# Patient Record
Sex: Male | Born: 1960 | Race: White | Hispanic: No | Marital: Married | Smoking: Never smoker
Health system: Southern US, Community
[De-identification: ages and names within clinical notes are randomized; demographics above are authoritative.]

## PROBLEM LIST (undated history)

## (undated) DIAGNOSIS — F32A Depression, unspecified: Secondary | ICD-10-CM

## (undated) DIAGNOSIS — K601 Chronic anal fissure: Secondary | ICD-10-CM

## (undated) DIAGNOSIS — N400 Enlarged prostate without lower urinary tract symptoms: Secondary | ICD-10-CM

## (undated) DIAGNOSIS — F329 Major depressive disorder, single episode, unspecified: Secondary | ICD-10-CM

## (undated) DIAGNOSIS — I10 Essential (primary) hypertension: Secondary | ICD-10-CM

## (undated) DIAGNOSIS — E8881 Metabolic syndrome: Secondary | ICD-10-CM

## (undated) DIAGNOSIS — R0602 Shortness of breath: Secondary | ICD-10-CM

## (undated) DIAGNOSIS — E785 Hyperlipidemia, unspecified: Secondary | ICD-10-CM

## (undated) DIAGNOSIS — E119 Type 2 diabetes mellitus without complications: Secondary | ICD-10-CM

## (undated) DIAGNOSIS — G4730 Sleep apnea, unspecified: Secondary | ICD-10-CM

## (undated) HISTORY — DX: Hyperlipidemia, unspecified: E78.5

## (undated) HISTORY — DX: Essential (primary) hypertension: I10

## (undated) HISTORY — DX: Sleep apnea, unspecified: G47.30

## (undated) HISTORY — PX: CHOLECYSTECTOMY: SHX55

## (undated) HISTORY — DX: Type 2 diabetes mellitus without complications: E11.9

## (undated) HISTORY — DX: Metabolic syndrome: E88.81

## (undated) HISTORY — DX: Benign prostatic hyperplasia without lower urinary tract symptoms: N40.0

## (undated) HISTORY — DX: Metabolic syndrome: E88.810

---

## 1998-07-30 ENCOUNTER — Ambulatory Visit (HOSPITAL_BASED_OUTPATIENT_CLINIC_OR_DEPARTMENT_OTHER): Admission: RE | Admit: 1998-07-30 | Discharge: 1998-07-30 | Payer: Self-pay | Admitting: Ophthalmology

## 2001-01-24 ENCOUNTER — Emergency Department (HOSPITAL_COMMUNITY): Admission: EM | Admit: 2001-01-24 | Discharge: 2001-01-24 | Payer: Self-pay | Admitting: Emergency Medicine

## 2001-01-24 ENCOUNTER — Encounter: Payer: Self-pay | Admitting: Emergency Medicine

## 2001-02-02 ENCOUNTER — Encounter: Payer: Self-pay | Admitting: Orthopedic Surgery

## 2001-02-02 ENCOUNTER — Ambulatory Visit (HOSPITAL_COMMUNITY): Admission: RE | Admit: 2001-02-02 | Discharge: 2001-02-02 | Payer: Self-pay | Admitting: Orthopedic Surgery

## 2001-06-04 ENCOUNTER — Encounter: Payer: Self-pay | Admitting: Family Medicine

## 2001-06-04 ENCOUNTER — Ambulatory Visit (HOSPITAL_COMMUNITY): Admission: RE | Admit: 2001-06-04 | Discharge: 2001-06-04 | Payer: Self-pay | Admitting: Family Medicine

## 2002-04-27 ENCOUNTER — Ambulatory Visit (HOSPITAL_BASED_OUTPATIENT_CLINIC_OR_DEPARTMENT_OTHER): Admission: RE | Admit: 2002-04-27 | Discharge: 2002-04-27 | Payer: Self-pay | Admitting: Family Medicine

## 2003-10-04 ENCOUNTER — Emergency Department (HOSPITAL_COMMUNITY): Admission: AD | Admit: 2003-10-04 | Discharge: 2003-10-04 | Payer: Self-pay | Admitting: Family Medicine

## 2004-05-01 ENCOUNTER — Emergency Department (HOSPITAL_COMMUNITY): Admission: EM | Admit: 2004-05-01 | Discharge: 2004-05-01 | Payer: Self-pay | Admitting: Family Medicine

## 2004-05-03 ENCOUNTER — Ambulatory Visit (HOSPITAL_COMMUNITY): Admission: RE | Admit: 2004-05-03 | Discharge: 2004-05-03 | Payer: Self-pay | Admitting: Orthopedic Surgery

## 2004-05-03 ENCOUNTER — Encounter (INDEPENDENT_AMBULATORY_CARE_PROVIDER_SITE_OTHER): Payer: Self-pay | Admitting: *Deleted

## 2004-05-03 ENCOUNTER — Ambulatory Visit (HOSPITAL_BASED_OUTPATIENT_CLINIC_OR_DEPARTMENT_OTHER): Admission: RE | Admit: 2004-05-03 | Discharge: 2004-05-03 | Payer: Self-pay | Admitting: Orthopedic Surgery

## 2010-06-19 ENCOUNTER — Ambulatory Visit (HOSPITAL_COMMUNITY): Admission: RE | Admit: 2010-06-19 | Discharge: 2010-06-19 | Payer: Self-pay | Admitting: General Surgery

## 2010-09-29 HISTORY — PX: APPENDECTOMY: SHX54

## 2010-11-27 IMAGING — CR DG CHEST 2V
2 series · 2 of 2 positions shown · non-contrast
Comparison: None.

CLINICAL DATA: Preop

CHEST - 2 VIEW

[w chest pa *]
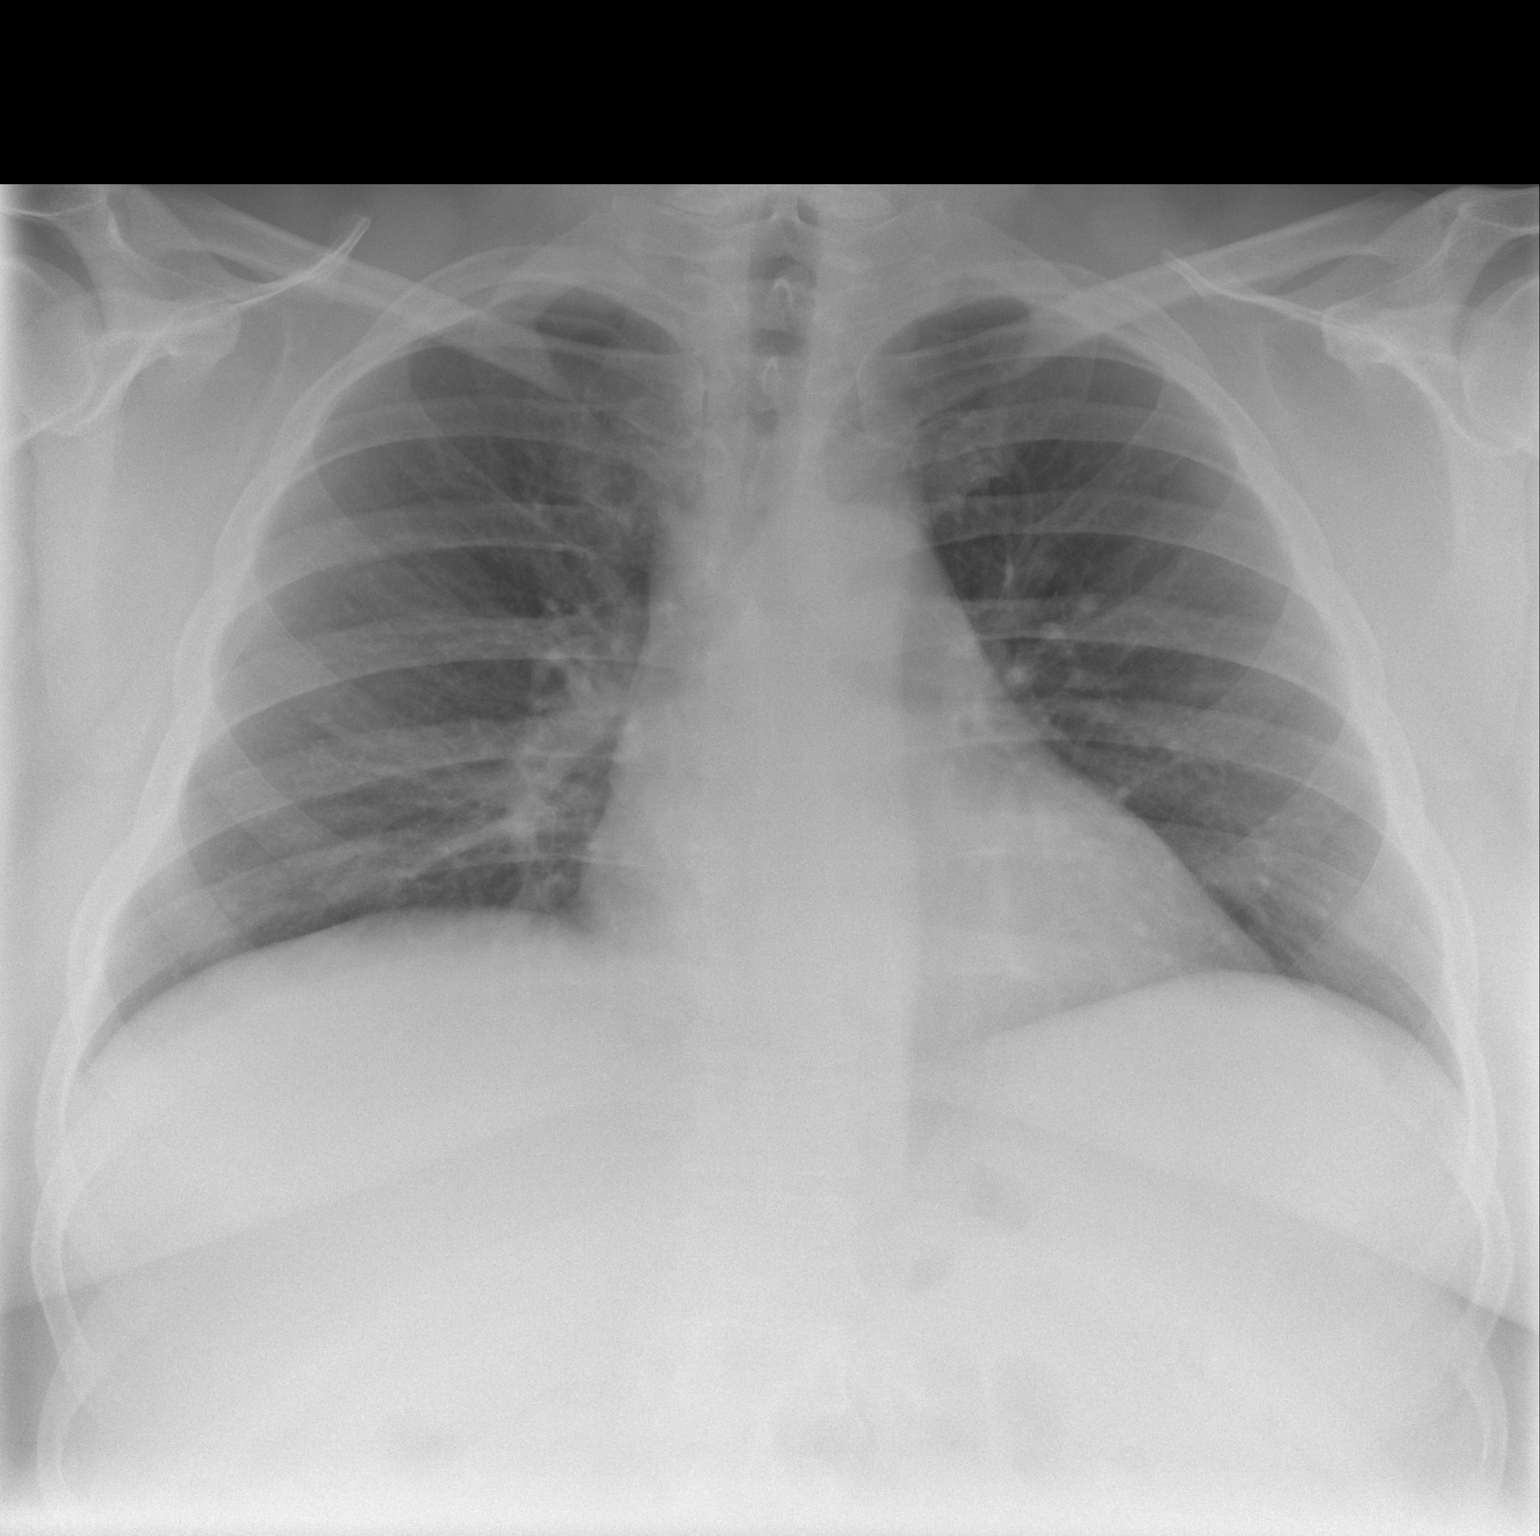

[w chest lat *]
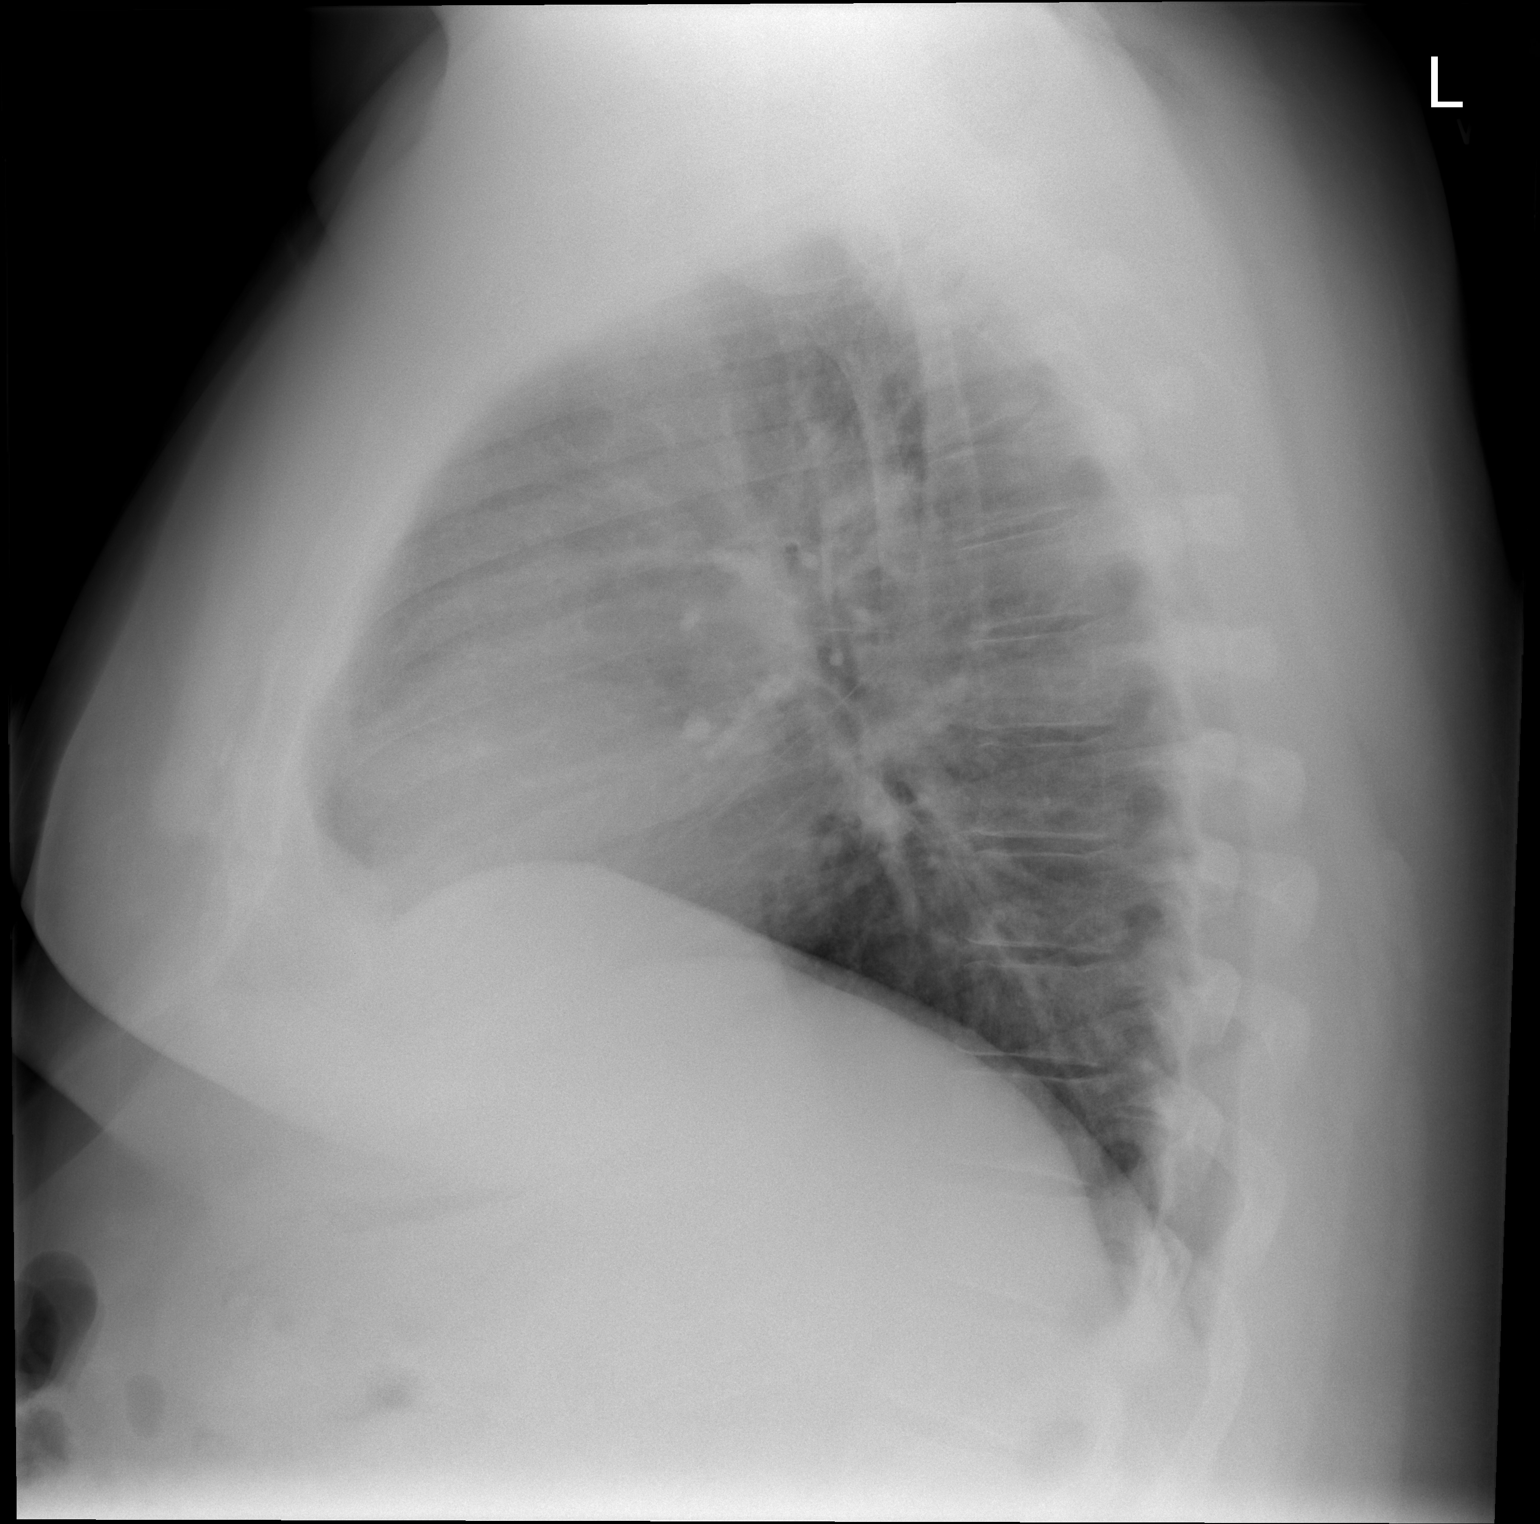

[2 of 2 positions shown; findings below may reference images not displayed]

FINDINGS: Normal mediastinum and heart silhouette.  Costophrenic
angles are clear.  No evidence of effusion, and trace, or
pneumothorax.
IMPRESSION: Normal chest radiograph.

## 2010-12-12 LAB — DIFFERENTIAL
Basophils Absolute: 0 10*3/uL (ref 0.0–0.1)
Basophils Relative: 0 % (ref 0–1)
Eosinophils Relative: 2 % (ref 0–5)
Lymphocytes Relative: 21 % (ref 12–46)
Monocytes Absolute: 0.7 10*3/uL (ref 0.1–1.0)

## 2010-12-12 LAB — CBC
HCT: 43.5 % (ref 39.0–52.0)
MCHC: 34.1 g/dL (ref 30.0–36.0)
MCV: 86.6 fL (ref 78.0–100.0)
RDW: 15.4 % (ref 11.5–15.5)

## 2010-12-12 LAB — BASIC METABOLIC PANEL
BUN: 15 mg/dL (ref 6–23)
CO2: 30 mEq/L (ref 19–32)
Calcium: 9.2 mg/dL (ref 8.4–10.5)
Chloride: 103 mEq/L (ref 96–112)
Creatinine, Ser: 1.11 mg/dL (ref 0.4–1.5)
GFR calc Af Amer: >60 mL/min (ref >60)
GFR calc non Af Amer: >60 mL/min (ref >60)
Glucose, Bld: 86 mg/dL (ref 70–99)
Potassium: 4 mEq/L (ref 3.5–5.1)
Sodium: 139 mEq/L (ref 135–145)

## 2010-12-12 LAB — SURGICAL PCR SCREEN
MRSA, PCR: NEGATIVE
Staphylococcus aureus: NEGATIVE

## 2011-02-14 NOTE — Op Note (Signed)
NAME:  Richard Serrano, Richard Serrano                        ACCOUNT NO.:  [id number]   MEDICAL RECORD NO.:  [id number]                   PATIENT TYPE:  AMB   LOCATION:  DSC                                  FACILITY:  [facility]   PHYSICIAN:  Cindee Salt, M.D.                    DATE OF BIRTH:  [birth year]   DATE OF PROCEDURE:  05/04/2004  DATE OF DISCHARGE:  05/03/2004                                 OPERATIVE REPORT   PREOPERATIVE DIAGNOSIS:  Mass, right ring finger.   POSTOPERATIVE DIAGNOSIS:  Mass, right ring finger.   OPERATION:  Excision of mass, right ring finger.   SURGEON:  Cindee Salt, M.D.   ASSISTANTCarolyne Fiscal.   ANESTHESIA:  Forearm-based IV regional anesthetic.   INDICATIONS FOR PROCEDURE:  The patient is a 50 year old male who suffered  an injury to his right ring finger with a piece of wood from a palate.  This  occurred approximately five months ago.  He has had progressive pain and  swelling, and when attempted removal was unsuccessful.  He is admitted now  for an excision of a fungating-type lesion from his right ring finger.   DESCRIPTION OF PROCEDURE:  The patient is brought to the operating room  where a forearm-based IV regional anesthetic was carried out without  difficulty.  He was prepped using DuraPrep, in the supine position, with his  right arm free.  After adequate anesthesia was given to the patient, an  oblique incision was made with an excision of the entire mass.  With blunt  and sharp dissection this was taken down through the subcutaneous tissue.  The entire mass was excised and sent to pathology.  The wound was irrigated.  The exploration revealed no further foreign material.  Bleeders were  electrocauterized.  The wound irrigated, and the skin closed with figure-of-  eight of horizontal mattress #5-0 nylon sutures.  A sterile compressive  dressing and splint to the finger were applied.  The patient tolerated the procedure well and was taken to the recovery room  for observation, in satisfactory condition.   DISPOSITION:  He is discharged home, to return to the [facility]  [place] in one week, on Vicodin.  He is presently taking Augmentin.                                               Cindee Salt, M.D.    Angelique Blonder  D:  05/03/2004  T:  05/05/2004  Job:  [id number]

## 2011-11-11 ENCOUNTER — Encounter: Payer: Self-pay | Admitting: Internal Medicine

## 2011-12-11 ENCOUNTER — Encounter: Payer: Self-pay | Admitting: Internal Medicine

## 2012-10-20 ENCOUNTER — Encounter: Payer: Self-pay | Admitting: Internal Medicine

## 2012-12-06 ENCOUNTER — Ambulatory Visit (AMBULATORY_SURGERY_CENTER): Payer: BC Managed Care – PPO | Admitting: *Deleted

## 2012-12-06 ENCOUNTER — Telehealth: Payer: Self-pay | Admitting: *Deleted

## 2012-12-06 VITALS — Ht 70.0 in | Wt 370.0 lb

## 2012-12-06 DIAGNOSIS — Z1211 Encounter for screening for malignant neoplasm of colon: Secondary | ICD-10-CM

## 2012-12-06 MED ORDER — NA SULFATE-K SULFATE-MG SULF 17.5-3.13-1.6 GM/177ML PO SOLN: ORAL | Status: DC

## 2012-12-06 NOTE — Telephone Encounter (Signed)
Patient is directed for screening colonoscopy, he did see Dr.Shaw for rectal pain and was given medications to try. He states he is no longer having any rectal pain, but needs routine colonoscopy. He has never had this. Patient weight is 370 lbs. He does have sleep apnea and uses a CPAP. Explained to patient that he needs hospital colonoscopy and that we would call him with this appointment. He understands. Thanks, [employer]

## 2012-12-07 ENCOUNTER — Encounter: Payer: Self-pay | Admitting: Internal Medicine

## 2012-12-07 NOTE — Telephone Encounter (Signed)
Patient is scheduled for colonoscopy for 01/26/13 8:30 [facility].  I have sent him in the instructions with the completed dates and times in the mail.  He will call back for any questions or concerns

## 2012-12-08 ENCOUNTER — Other Ambulatory Visit: Payer: Self-pay

## 2012-12-08 DIAGNOSIS — Z1211 Encounter for screening for malignant neoplasm of colon: Secondary | ICD-10-CM

## 2012-12-15 NOTE — Telephone Encounter (Signed)
Orders are in Sir.

## 2012-12-15 NOTE — Telephone Encounter (Signed)
Please make sure orders are in

## 2012-12-16 ENCOUNTER — Encounter: Payer: Self-pay | Admitting: Internal Medicine

## 2013-01-24 ENCOUNTER — Telehealth: Payer: Self-pay | Admitting: Internal Medicine

## 2013-01-24 NOTE — Telephone Encounter (Signed)
All questions answered, he will call back if he has additional.

## 2013-01-26 ENCOUNTER — Encounter (HOSPITAL_COMMUNITY): Payer: Self-pay | Admitting: *Deleted

## 2013-01-26 ENCOUNTER — Ambulatory Visit (HOSPITAL_COMMUNITY)
Admission: RE | Admit: 2013-01-26 | Discharge: 2013-01-26 | Disposition: A | Discharge status: Home / Self Care | Payer: BC Managed Care – PPO | Source: Ambulatory Visit | Attending: Internal Medicine | Admitting: Internal Medicine

## 2013-01-26 ENCOUNTER — Encounter (HOSPITAL_COMMUNITY)
Admission: RE | Disposition: A | Discharge status: Home / Self Care | Payer: Self-pay | Source: Ambulatory Visit | Attending: Internal Medicine

## 2013-01-26 ENCOUNTER — Other Ambulatory Visit: Payer: Self-pay | Admitting: Internal Medicine

## 2013-01-26 DIAGNOSIS — D126 Benign neoplasm of colon, unspecified: Secondary | ICD-10-CM | POA: Insufficient documentation

## 2013-01-26 DIAGNOSIS — F329 Major depressive disorder, single episode, unspecified: Secondary | ICD-10-CM | POA: Insufficient documentation

## 2013-01-26 DIAGNOSIS — E785 Hyperlipidemia, unspecified: Secondary | ICD-10-CM | POA: Insufficient documentation

## 2013-01-26 DIAGNOSIS — Z1211 Encounter for screening for malignant neoplasm of colon: Secondary | ICD-10-CM

## 2013-01-26 DIAGNOSIS — K602 Anal fissure, unspecified: Secondary | ICD-10-CM | POA: Insufficient documentation

## 2013-01-26 DIAGNOSIS — F3289 Other specified depressive episodes: Secondary | ICD-10-CM | POA: Insufficient documentation

## 2013-01-26 DIAGNOSIS — Z79899 Other long term (current) drug therapy: Secondary | ICD-10-CM | POA: Insufficient documentation

## 2013-01-26 DIAGNOSIS — G4730 Sleep apnea, unspecified: Secondary | ICD-10-CM | POA: Insufficient documentation

## 2013-01-26 DIAGNOSIS — K601 Chronic anal fissure: Secondary | ICD-10-CM

## 2013-01-26 DIAGNOSIS — Z7982 Long term (current) use of aspirin: Secondary | ICD-10-CM | POA: Insufficient documentation

## 2013-01-26 DIAGNOSIS — I10 Essential (primary) hypertension: Secondary | ICD-10-CM | POA: Insufficient documentation

## 2013-01-26 DIAGNOSIS — R933 Abnormal findings on diagnostic imaging of other parts of digestive tract: Secondary | ICD-10-CM | POA: Diagnosis present

## 2013-01-26 HISTORY — DX: Chronic anal fissure: K60.1

## 2013-01-26 HISTORY — DX: Shortness of breath: R06.02

## 2013-01-26 HISTORY — PX: COLONOSCOPY: SHX5424

## 2013-01-26 HISTORY — DX: Major depressive disorder, single episode, unspecified: F32.9

## 2013-01-26 HISTORY — DX: Depression, unspecified: F32.A

## 2013-01-26 SURGERY — COLONOSCOPY
Anesthesia: Moderate Sedation

## 2013-01-26 MED ORDER — MIDAZOLAM HCL 10 MG/2ML IJ SOLN
INTRAMUSCULAR | Status: AC
  Filled 2013-01-26: qty 2

## 2013-01-26 MED ORDER — FENTANYL CITRATE 0.05 MG/ML IJ SOLN
INTRAMUSCULAR | Status: AC
  Filled 2013-01-26: qty 2

## 2013-01-26 MED ORDER — MIDAZOLAM HCL 5 MG/5ML IJ SOLN
INTRAMUSCULAR | Status: DC | PRN
  Administered 2013-01-26 (×3): 2 mg via INTRAVENOUS

## 2013-01-26 MED ORDER — FENTANYL CITRATE 0.05 MG/ML IJ SOLN
INTRAMUSCULAR | Status: DC | PRN
  Administered 2013-01-26 (×3): 25 ug via INTRAVENOUS

## 2013-01-26 MED ORDER — DIPHENHYDRAMINE HCL 50 MG/ML IJ SOLN
INTRAMUSCULAR | Status: AC
  Filled 2013-01-26: qty 1

## 2013-01-26 MED ORDER — DILTIAZEM GEL 2 %: 1.0000 "application " | Freq: Two times a day (BID) | CUTANEOUS | Status: DC

## 2013-01-26 NOTE — Op Note (Signed)
[facility] [address] [place], [address]   COLONOSCOPY PROCEDURE REPORT  PATIENT: Richard Serrano, Richard Serrano  MR#: [id number] BIRTHDATE: 01-20-1961 , 52  yrs. old GENDER: Male ENDOSCOPIST: Iva Boop, MD, [facility] REFERRED BY:W.  Buren Kos, M.D. PROCEDURE DATE:  01/26/2013 PROCEDURE:   Colonoscopy with snare polypectomy ASA CLASS:   Class III INDICATIONS:Colorectal cancer screening. MEDICATIONS: Fentanyl 75 mcg IV and Versed-Detailed 6 mg IV  DESCRIPTION OF PROCEDURE:   After the risks benefits and alternatives of the procedure were thoroughly explained, informed consent was obtained.  A digital rectal exam revealed an anal fissure.   The Pentax Adult Colonscope [id number]  endoscope was introduced through the anus and advanced to the cecum, which was identified by both the appendix and ileocecal valve. No adverse events experienced.   The quality of the prep was excellent using Suprep  The instrument was then slowly withdrawn as the colon was fully examined.      COLON FINDINGS: Medium sized anal fissure was found in the anal canal.   A polypoid shaped sessile lesion measuring 5 mm maximum in size was found at the cecum.  A polypectomy was performed with a cold snare.  The resection was complete and the ?polyp tissue was completely retrieved.   The colon mucosa was otherwise normal.   A right colon retroflexion was performed.  Retroflexed views revealed an anal fissure. The time to cecum=2 minutes 0 seconds.  Withdrawal time=12 minutes 0 seconds.  The scope was withdrawn and the procedure completed. COMPLICATIONS: There were no complications.  ENDOSCOPIC IMPRESSION: 1.   Medium sized anal fissure in the anal canal 2.   ? Sessile polyp(s)measuring 5 mm in size was found at the cecum; polypectomy was performed with a cold snare 3.   The colon mucosa was otherwise normal - excellent prep  RECOMMENDATIONS: 1.  Await pathology results - will notify and timing  of next colonoscoopy 2.  diltiazem gel 2% bid x 6 weeks - if persistent anal sxs would see surgeon   eSigned:  Iva Boop, MD, [facility] 01/26/2013 9:23 AM  cc: Kari Baars, MD and The Patient

## 2013-01-26 NOTE — H&P (Signed)
[facility]   Primary Care Physician:  Kari Baars, MD   Reason for Procedure: Screening colonoscopy - colon cancer screening  Assessment:  As above     Plan:   Colonoscopy The risks and benefits as well as alternatives of endoscopic procedure(s) have been discussed and reviewed. All questions answered. The patient agrees to proceed.    HPI: Richard Serrano is a 52 y.o. male here for screening colonoscopy. In past he has had some rectal pain, better with fiber supplements.   Past Medical History  Diagnosis Date  . Hyperlipidemia   . Hypertension   . Sleep apnea     Cpap  . Depression   . Shortness of breath     Past Surgical History  Procedure Laterality Date  . Appendectomy  2012    Prior to Admission medications   Medication Sig Start Date End Date Taking? Authorizing Provider  Ascorbic Acid (VITAMIN C) 1000 MG tablet Take 1,000 mg by mouth daily.   Yes Historical Provider, MD  aspirin 81 MG tablet Take 81 mg by mouth daily.   Yes Historical Provider, MD  atorvastatin (LIPITOR) 40 MG tablet Take 40 mg by mouth daily.   Yes Historical Provider, MD  docusate sodium (COLACE) 100 MG capsule Take 100 mg by mouth 2 (two) times daily.   Yes Historical Provider, MD  doxazosin (CARDURA) 4 MG tablet Take 4 mg by mouth daily.   Yes Historical Provider, MD  escitalopram (LEXAPRO) 10 MG tablet Take 10 mg by mouth daily.   Yes Historical Provider, MD  lisinopril (PRINIVIL,ZESTRIL) 20 MG tablet Take 20 mg by mouth daily.   Yes Historical Provider, MD  Multiple Vitamin (MULTIVITAMIN) tablet Take 1 tablet by mouth daily.   Yes Historical Provider, MD  Omega-3 Fatty Acids (FISH OIL) 1200 MG CAPS Take 1 capsule by mouth daily.   Yes Historical Provider, MD  Phentermine-Topiramate (QSYMIA) 11.25-69 MG CP24 Take 1 tablet by mouth daily.   Yes Historical Provider, MD  Psyllium (METAMUCIL PO) Take 1 each by mouth daily.   Yes Historical Provider, MD    No current  facility-administered medications for this encounter.    Allergies as of 12/07/2012  . (No Known Allergies)    Family History  Problem Relation Age of Onset  . Colon cancer Neg Hx     History   Social History  . Marital Status: Married    Spouse Name: N/A    Number of Children: N/A  . Years of Education: N/A   Occupational History  . Not on file.   Social History Main Topics  . Smoking status: Never Smoker   . Smokeless tobacco: Never Used  . Alcohol Use: Yes     Comment: 4-5 drinks weekends.  . Drug Use: No  . Sexually Active: Not on file   Other Topics Concern  . Not on file   Social History Narrative  . No narrative on file    Review of Systems: All other review of systems negative except as mentioned in the HPI.  Physical Exam: Vital signs in last 24 hours: Temp:  [97.8 F (36.6 C)] 97.8 F (36.6 C) (04/30 0809) Resp:  [11] 11 (04/30 0809) BP: (133)/(89) 133/89 mmHg (04/30 0809) SpO2:  [98 %] 98 % (04/30 0809) Weight:  [375 lb (170.099 kg)] 375 lb (170.099 kg) (04/30 0809)   General:   Alert,  Well-developed, well-nourished, pleasant and cooperative in NAD Eyes:  Sclera clear, no icterus.   Conjunctiva pink.  Mouth:  No deformity or lesions.  Oropharynx pink & moist. Lungs:  Clear throughout to auscultation.   Heart:  Regular rate and rhythm; no murmurs, clicks, rubs,  or gallops. Abdomen:  Soft, nontender and nondistended. No masses, hepatosplenomegaly or hernias noted. Normal bowel sounds, without guarding, and without rebound.   Psych:  Alert and cooperative. Normal mood and affect.        LOS: 0 days      @Carl  Sena Slate, MD, [facility] [phone] (pager) 01/26/2013 8:39 AM@

## 2013-01-27 ENCOUNTER — Encounter: Payer: Self-pay | Admitting: Internal Medicine

## 2013-01-27 ENCOUNTER — Encounter (HOSPITAL_COMMUNITY): Payer: Self-pay | Admitting: Internal Medicine

## 2013-01-27 NOTE — Progress Notes (Signed)
Quick Note:  Benign mucosa - not a polyp - repeat colonoscopy 10 years 2024 ______

## 2013-01-27 NOTE — Progress Notes (Signed)
Quick Note:  Also had anal fissure treated with diltiazem - if fails to respond in 6 weeks needs [facility] eval ______

## 2016-05-16 ENCOUNTER — Ambulatory Visit (INDEPENDENT_AMBULATORY_CARE_PROVIDER_SITE_OTHER): Payer: No Typology Code available for payment source | Admitting: Podiatry

## 2016-05-16 DIAGNOSIS — M79676 Pain in unspecified toe(s): Secondary | ICD-10-CM | POA: Diagnosis not present

## 2016-05-16 DIAGNOSIS — E119 Type 2 diabetes mellitus without complications: Secondary | ICD-10-CM | POA: Diagnosis not present

## 2016-05-16 DIAGNOSIS — B351 Tinea unguium: Secondary | ICD-10-CM

## 2016-05-16 NOTE — Progress Notes (Signed)
Complaint:  Visit Type: Patient returns to my office for continued preventative foot care services. Complaint: Patient states" my nails have grown long and  become painful to walk and wear shoes" Patient has been diagnosed with DM with no foot complications. The patient presents for preventative foot care services. No changes to ROS  Podiatric Exam: Vascular: dorsalis pedis and posterior tibial pulses are palpable bilateral. Capillary return is immediate. Temperature gradient is WNL. Skin turgor WNL.  Hair present on digits.  Sensorium: Normal Semmes Weinstein monofilament test. Normal tactile sensation bilaterally. Nail Exam: Pt has thick disfigured discolored nails with subungual debris noted bilateral entire nail . Ulcer Exam: There is no evidence of ulcer or pre-ulcerative changes or infection. Orthopedic Exam: Muscle tone and strength are WNL. No limitations in general ROM. No crepitus or effusions noted. Foot type and digits show no abnormalities. Bony prominences are unremarkable. Skin: No Porokeratosis. No infection or ulcers  Diagnosis:  Onychomycosis, , Pain in right toe, pain in left toes  Treatment & Plan Procedures and Treatment: Consent by patient was obtained for treatment procedures. The patient understood the discussion of treatment and procedures well. All questions were answered thoroughly reviewed. Debridement of mycotic and hypertrophic toenails, 1 through 5 bilateral and clearing of subungual debris. No ulceration, no infection noted.  Return Visit-Office Procedure: Patient instructed to return to the office for a follow up visit 3 months for continued evaluation and treatment.    Gardiner Barefoot DPM

## 2016-06-25 ENCOUNTER — Ambulatory Visit (INDEPENDENT_AMBULATORY_CARE_PROVIDER_SITE_OTHER): Payer: No Typology Code available for payment source | Admitting: Neurology

## 2016-06-25 ENCOUNTER — Encounter: Payer: Self-pay | Admitting: Neurology

## 2016-06-25 VITALS — BP 128/88 | HR 100 | Resp 20 | Ht 69.0 in | Wt >= 6400 oz

## 2016-06-25 DIAGNOSIS — Z9989 Dependence on other enabling machines and devices: Principal | ICD-10-CM

## 2016-06-25 DIAGNOSIS — R351 Nocturia: Secondary | ICD-10-CM

## 2016-06-25 DIAGNOSIS — R197 Diarrhea, unspecified: Secondary | ICD-10-CM | POA: Diagnosis not present

## 2016-06-25 DIAGNOSIS — E669 Obesity, unspecified: Secondary | ICD-10-CM

## 2016-06-25 DIAGNOSIS — G4701 Insomnia due to medical condition: Secondary | ICD-10-CM | POA: Diagnosis not present

## 2016-06-25 DIAGNOSIS — G4733 Obstructive sleep apnea (adult) (pediatric): Secondary | ICD-10-CM | POA: Diagnosis not present

## 2016-06-25 MED ORDER — DIAZEPAM 2 MG PO TABS: ORAL_TABLET | ORAL | 0 refills | Status: DC

## 2016-06-25 MED ORDER — ESCITALOPRAM OXALATE 10 MG PO TABS: 20.0000 mg | ORAL_TABLET | Freq: Every day | ORAL | 3 refills | Status: AC

## 2016-06-25 NOTE — Progress Notes (Signed)
SLEEP MEDICINE CLINIC   Provider:  Larey Serrano, M D  Referring Provider: Marton Redwood, MD Primary Care Physician:  Richard Redwood, MD  Chief Complaint  Patient presents with  . New Patient (Initial Visit)    on cpap, doesn't know when last sleep study was, uses [facility], has 2 cpaps, one does not have a chip    HPI:  Richard Serrano is a 55 y.o. male , seen here as a referral/ revisit  from Dr. Brigitte Serrano for a sleep evaluation,  Mr. Richard Serrano recalls that his sleep study took place at the [facility] hardened sleep center and that he had seen me in follow-up, which would date his sleep study from  2005 through 2007.  He has used CPAP since being diagnosed with severe sleep apnea and brought me at downloadable CPAP machine, history and model. He owns a second machine that he has placed on his second bedroom. That one is older and is not Office manager. He complains of insomnia. He is daytime sleepy and feels that he does not get enough sleep. His Epworth sleepiness score today is endorsed at 7 points fatigue severity score was not endorsed. His durable medical equipment company is [facility].  [name] documented 77% of use for the machine was downloadable capacity, but on the other days he stated he used his other machine. Compliance may well be 100%. Average user time is 7 hours and 27 minutes, he is using an AutoSet between 12 and 20 cm water was 1 cm EPR, 95th percentile pressure is 16.7 cm he has very rare air leaks, residual AHI is 1.1 which speaks for good compliance and good resolution of apnea.  His risk factors are super-obesity, hypertension, diabetes,  Diarrhea- his chief complaint is insomnia, nocturia.  He describes that Lunesta, Ambien, and several other over-the-counter medications have not helped to alleviate his insomnia. It is not clear to me if this is subjective or objective insomnia a question of sleep perception or if there are ever organic  reasons for insomnia. His apnea seems very well treated.   Sleep habits are as follows:  He usually goes to the bedroom between 10 and 11 PM he often lays awake for an hour or 2 before initiating sleep. He wakes up every hour or so with the urge to urinate, nocturia fragmenting his night. He feels that he gets his best sleep between 5 and 10 AM so his sleep time has been progressively later and intrudes into daytime now. He is self-employed but his daytime sleep can also be restricted by the activities of the grandchildren and the house etc. His bedroom is cool, quiet and dark. He sleeps with the fan for background noise and cooling. He sleeps usually supine. He uses only one pillow for head support. He usually can go back to sleep, but has to get up 6 -7 times at night to urinate ! Often he has diarrhea, too.    Social history:  Married , Armed forces operational officer- Manufacturing engineer.    Review of Systems: Out of a complete 14 system review, the patient complains of only the following symptoms, and all other reviewed systems are negative. Weight loss of 25 pounds in the 12 month  Insomnia, Nocturia.  Fatigue 3 plus.  Epworth score  8 , Fatigue severity score n/a   , depression score n/a    Social History   Social History  . Marital status: Married    Spouse name: N/A  .  Number of children: N/A  . Years of education: N/A   Occupational History  . Not on file.   Social History Main Topics  . Smoking status: Never Smoker  . Smokeless tobacco: Never Used  . Alcohol use Yes     Comment: 4-5 drinks weekends.  . Drug use: No  . Sexual activity: Not on file   Other Topics Concern  . Not on file   Social History Narrative  . No narrative on file    Family History  Problem Relation Age of Onset  . Colon cancer Neg Hx     Past Medical History:  Diagnosis Date  . Benign prostatic hypertrophy   . 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 Depression   . Diabetes mellitus without complication  ([place])   . Hyperlipidemia   . Hypertension   . Metabolic syndrome   . Shortness of breath   . Sleep apnea    Cpap    Past Surgical History:  Procedure Laterality Date  . APPENDECTOMY  2012  . COLONOSCOPY N/A 01/26/2013   Procedure: COLONOSCOPY;  Surgeon: Richard Mayer, MD;  Location: [facility] END[facility]COPY;  Service: Endoscopy;  Laterality: N/A;    Current Outpatient Prescriptions  Medication Sig Dispense Refill  . Ascorbic Acid (VITAMIN C) 1000 MG tablet Take 1,000 mg by mouth daily.    Marland Kitchen aspirin 81 MG tablet Take 81 mg by mouth daily.    Marland Kitchen atorvastatin (LIPITOR) 40 MG tablet Take 40 mg by mouth daily.    Marland Kitchen diltiazem 2 % GEL Apply 1 application topically 2 (two) times daily. Small amount to anal area 30 g 1  . docusate sodium (COLACE) 100 MG capsule Take 100 mg by mouth 2 (two) times daily.    Marland Kitchen doxazosin (CARDURA) 4 MG tablet Take 4 mg by mouth daily.    Marland Kitchen escitalopram (LEXAPRO) 10 MG tablet Take 10 mg by mouth daily.    . Exenatide ER (BYDUREON) 2 MG PEN Inject 2 mg into the skin once a week.    Marland Kitchen lisinopril (PRINIVIL,ZESTRIL) 20 MG tablet Take 20 mg by mouth daily.    . metFORMIN (GLUCOPHAGE) 1000 MG tablet Take 1,000 mg by mouth 2 (two) times daily with a meal.    . Multiple Vitamin (MULTIVITAMIN) tablet Take 1 tablet by mouth daily.    . Omega-3 Fatty Acids (FISH OIL) 1200 MG CAPS Take 1 capsule by mouth daily.    . Phentermine-Topiramate (QSYMIA) 11.25-69 MG CP24 Take 1 tablet by mouth daily.    . Psyllium (METAMUCIL PO) Take 1 each by mouth daily.     No current facility-administered medications for this visit.     Allergies as of 06/25/2016  . (No Known Allergies)    Vitals: BP 128/88   Serrano 100   Resp 20   Ht 5\' 9"  (1.753 m)   Wt (!) 408 lb (185.1 kg)   BMI 60.25 kg/m  Last Weight:  Wt Readings from Last 1 Encounters:  06/25/16 (!) 408 lb (185.1 kg)   [id number] mass index is 60.25 kg/m.     Last Height:   Ht Readings from Last 1 Encounters:  06/25/16 5\' 9"  (1.753  m)    Physical exam:  General: The patient is awake, alert and appears not in acute distress. The patient is well groomed. Head: Normocephalic, atraumatic. Neck is supple. Mallampati 5,  neck circumference:22. Nasal airflow restricted/ congested. , Cardiovascular:  Regular rate and rhythm , without  murmurs or carotid bruit, and  without distended neck veins. Respiratory: Lungs are clear to auscultation. Skin:  Without evidence of edema, or rash Trunk: BMI is super obese. The patient's posture is hunched.   Neurologic exam : The patient is awake and alert, oriented to place and time.   Memory subjective described as intact.  Attention span & concentration ability appears normal.  Speech is fluent,  without  dysarthria, dysphonia or aphasia.  Mood and affect are appropriate.  Cranial nerves: Pupils are equal and briskly reactive to light. Funduscopic exam without  evidence of pallor or edema. Extraocular movements  in vertical and horizontal planes intact and without nystagmus. Visual fields by finger perimetry are intact. Hearing to finger rub intact. Facial sensation intact to fine touch.Facial motor strength is symmetric and tongue and uvula move midline. Shoulder shrug was symmetrical.   Motor exam:   Normal tone, muscle bulk and symmetric strength in all extremities. Sensory:  Fine touch, pinprick and vibration were tested in all extremities. Proprioception tested in the upper extremities was normal. Coordination: Rapid alternating movements in the fingers/hands was normal. Finger-to-nose maneuver  normal without evidence of ataxia, dysmetria or tremor. Gait and station: Patient walks without assistive device and is able unassisted to climb up to the exam table. Strength within normal limits. Stance is stable and normal.   Deep tendon reflexes: in the upper and lower extremities are symmetric and intact.    The patient was advised of the nature of the diagnosed sleep disorder , the  treatment options and risks for general a health and wellness arising from not treating the condition.  I spent more than 45  minutes of face to face time with the patient. Greater than 50% of time was spent in counseling and coordination of care. We have discussed the diagnosis and differential and I answered the patient's questions.     Assessment:  After physical and neurologic examination, review of laboratory studies,  Personal review of imaging studies, reports of other /same  Imaging studies ,  Results of polysomnography/ neurophysiology testing and pre-existing records as far as provided in visit., my assessment is   1) Mr. Leisenring reports that he has quite severe insomnia and the problem seems #1 sleep initiation a sleep latency of 1 or 2 hours at times.  But once sleep is initiated it is fragmented by nocturia. Nocturia can be induced by diuretic medication, can be related to prostate hyper trophy, if he can only sleep for 1 hour at a time he would of course feel miserable in the morning. No sleep aid will help him to sleep more prolonged at night as long as he has the urge to urinate so frequently.  2) over-the-counter medications such as NyQuil and Benadryl sometimes had a paradoxically stimulating effect. They also introduced restless leg syndrome. This is a known side effect.  3) the patient's sleep apnea seems to be excellently controlled on an auto CPAP, and I do not think that apnea is to call us of his insomnia at all. There was frequent nocturia I would strongly recommend to send him to a urologist. He does not describe bladder spasms just urinary frequency, he does not report a delayed urinary stream, the urine stream is not as strong as it used to be. He does have the feeling of incompletely emptying his bladder. He gets the best urine relief after a deep sleep period.   He received CPAP supplies through [facility]. I would like to prescribe him a benzodiazepine to  allow  him to initiate sleep but it will not prevent him from waking up related to nocturia. It will make his sleep latency shorter however  Plan:  Treatment plan and additional workup :  Urology consult.  CPAP must not be changed, he is compliant and OSA is alleviated.  ONO to see if he is hypoxemic. This may explain fatigue. Diazepam 2 mg at bedtime for 7 days- phone report back if the sleep latency has been reduced.   Detrol to be considered by PCP or Urologist.  4 week follow up.       Asencion Partridge Therron Sells MD  06/25/2016   CC: Richard Serrano, [place], [place] [id number]

## 2016-06-25 NOTE — Addendum Note (Signed)
Addended by: Larey Seat on: 06/25/2016 08:45 AM   Modules accepted: Orders

## 2016-06-25 NOTE — Patient Instructions (Signed)
Insomnia Insomnia is a sleep disorder that makes it difficult to fall asleep or to stay asleep. Insomnia can cause tiredness (fatigue), low energy, difficulty concentrating, mood swings, and poor performance at work or school.  There are three different ways to classify insomnia:  Difficulty falling asleep.  Difficulty staying asleep.  Waking up too early in the morning. Any type of insomnia can be long-term (chronic) or short-term (acute). Both are common. Short-term insomnia usually lasts for three months or less. Chronic insomnia occurs at least three times a week for longer than three months. CAUSES  Insomnia may be caused by another condition, situation, or substance, such as:  Anxiety.  Certain medicines.  Gastroesophageal reflux disease (GERD) or other gastrointestinal conditions.  Asthma or other breathing conditions.  Restless legs syndrome, sleep apnea, or other sleep disorders.  Chronic pain.  Menopause. This may include hot flashes.  Stroke.  Abuse of alcohol, tobacco, or illegal drugs.  Depression.  Caffeine.   Neurological disorders, such as Alzheimer disease.  An overactive thyroid (hyperthyroidism). The cause of insomnia may not be known. RISK FACTORS Risk factors for insomnia include:  Gender. Women are more commonly affected than men.  Age. Insomnia is more common as you get older.  Stress. This may involve your professional or personal life.  Income. Insomnia is more common in people with lower income.  Lack of exercise.   Irregular work schedule or night shifts.  Traveling between different time zones. SIGNS AND SYMPTOMS If you have insomnia, trouble falling asleep or trouble staying asleep is the main symptom. This may lead to other symptoms, such as:  Feeling fatigued.  Feeling nervous about going to sleep.  Not feeling rested in the morning.  Having trouble concentrating.  Feeling irritable, anxious, or depressed. TREATMENT   Treatment for insomnia depends on the cause. If your insomnia is caused by an underlying condition, treatment will focus on addressing the condition. Treatment may also include:   Medicines to help you sleep.  Counseling or therapy.  Lifestyle adjustments. HOME CARE INSTRUCTIONS   Take medicines only as directed by your health care provider.  Keep regular sleeping and waking hours. Avoid naps.  Keep a sleep diary to help you and your health care provider figure out what could be causing your insomnia. Include:   When you sleep.  When you wake up during the night.  How well you sleep.   How rested you feel the next day.  Any side effects of medicines you are taking.  What you eat and drink.   Make your bedroom a comfortable place where it is easy to fall asleep:  Put up shades or special blackout curtains to block light from outside.  Use a white noise machine to block noise.  Keep the temperature cool.   Exercise regularly as directed by your health care provider. Avoid exercising right before bedtime.  Use relaxation techniques to manage stress. Ask your health care provider to suggest some techniques that may work well for you. These may include:  Breathing exercises.  Routines to release muscle tension.  Visualizing peaceful scenes.  Cut back on alcohol, caffeinated beverages, and cigarettes, especially close to bedtime. These can disrupt your sleep.  Do not overeat or eat spicy foods right before bedtime. This can lead to digestive discomfort that can make it hard for you to sleep.  Limit screen use before bedtime. This includes:  Watching TV.  Using your smartphone, tablet, and computer.  Stick to a routine. This   can help you fall asleep faster. Try to do a quiet activity, brush your teeth, and go to bed at the same time each night.  Get out of bed if you are still awake after 15 minutes of trying to sleep. Keep the lights down, but try reading or  doing a quiet activity. When you feel sleepy, go back to bed.  Make sure that you drive carefully. Avoid driving if you feel very sleepy.  Keep all follow-up appointments as directed by your health care provider. This is important. SEEK MEDICAL CARE IF:   You are tired throughout the day or have trouble in your daily routine due to sleepiness.  You continue to have sleep problems or your sleep problems get worse. SEEK IMMEDIATE MEDICAL CARE IF:   You have serious thoughts about hurting yourself or someone else.   This information is not intended to replace advice given to you by your health care provider. Make sure you discuss any questions you have with your health care provider.   Document Released: 09/12/2000 Document Revised: 06/06/2015 Document Reviewed: 06/16/2014 Elsevier Interactive Patient Education 2016 [employer]  

## 2016-08-15 ENCOUNTER — Ambulatory Visit (INDEPENDENT_AMBULATORY_CARE_PROVIDER_SITE_OTHER): Payer: No Typology Code available for payment source | Admitting: Podiatry

## 2016-08-15 ENCOUNTER — Encounter: Payer: Self-pay | Admitting: Podiatry

## 2016-08-15 VITALS — Ht 69.0 in | Wt >= 6400 oz

## 2016-08-15 DIAGNOSIS — B351 Tinea unguium: Secondary | ICD-10-CM

## 2016-08-15 DIAGNOSIS — M79676 Pain in unspecified toe(s): Secondary | ICD-10-CM

## 2016-08-15 DIAGNOSIS — E119 Type 2 diabetes mellitus without complications: Secondary | ICD-10-CM

## 2016-08-15 NOTE — Progress Notes (Signed)
Complaint:  Visit Type: Patient returns to my office for continued preventative foot care services. Complaint: Patient states" my nails have grown long and thick and become painful to walk and wear shoes" Patient has been diagnosed with DM with no foot complications. The patient presents for preventative foot care services. No changes to ROS  Podiatric Exam: Vascular: dorsalis pedis and posterior tibial pulses are palpable bilateral. Capillary return is immediate. Temperature gradient is WNL. Skin turgor WNL  Sensorium: Normal Semmes Weinstein monofilament test. Normal tactile sensation bilaterally. Nail Exam: Pt has thick disfigured discolored nails with subungual debris noted bilateral entire nail hallux through fifth toenails Ulcer Exam: There is no evidence of ulcer or pre-ulcerative changes or infection. Orthopedic Exam: Muscle tone and strength are WNL. No limitations in general ROM. No crepitus or effusions noted. Foot type and digits show no abnormalities. Bony prominences are unremarkable. Skin: No Porokeratosis. No infection or ulcers  Diagnosis:  Onychomycosis, , Pain in right toe, pain in left toes  Treatment & Plan Procedures and Treatment: Consent by patient was obtained for treatment procedures. The patient understood the discussion of treatment and procedures well. All questions were answered thoroughly reviewed. Debridement of mycotic and hypertrophic toenails, 1 through 5 bilateral and clearing of subungual debris. No ulceration, no infection noted.  Return Visit-Office Procedure: Patient instructed to return to the office for a follow up visit 3 months for continued evaluation and treatment.    Charizma Gardiner DPM 

## 2016-08-18 ENCOUNTER — Ambulatory Visit: Payer: No Typology Code available for payment source | Admitting: Neurology

## 2016-08-19 ENCOUNTER — Encounter: Payer: Self-pay | Admitting: Neurology

## 2016-11-14 ENCOUNTER — Ambulatory Visit: Payer: No Typology Code available for payment source | Admitting: Podiatry

## 2016-11-21 ENCOUNTER — Ambulatory Visit (INDEPENDENT_AMBULATORY_CARE_PROVIDER_SITE_OTHER): Payer: No Typology Code available for payment source | Admitting: Podiatry

## 2016-11-21 ENCOUNTER — Encounter: Payer: Self-pay | Admitting: Podiatry

## 2016-11-21 DIAGNOSIS — B351 Tinea unguium: Secondary | ICD-10-CM | POA: Diagnosis not present

## 2016-11-21 DIAGNOSIS — M79676 Pain in unspecified toe(s): Secondary | ICD-10-CM | POA: Diagnosis not present

## 2016-11-21 DIAGNOSIS — E119 Type 2 diabetes mellitus without complications: Secondary | ICD-10-CM

## 2016-11-21 NOTE — Progress Notes (Signed)
Complaint:  Visit Type: Patient returns to my office for continued preventative foot care services. Complaint: Patient states" my nails have grown long and thick and become painful to walk and wear shoes" Patient has been diagnosed with DM with no foot complications. The patient presents for preventative foot care services. No changes to ROS  Podiatric Exam: Vascular: dorsalis pedis and posterior tibial pulses are palpable bilateral. Capillary return is immediate. Temperature gradient is WNL. Skin turgor WNL  Sensorium: Normal Semmes Weinstein monofilament test. Normal tactile sensation bilaterally. Nail Exam: Pt has thick disfigured discolored nails with subungual debris noted bilateral entire nail hallux through fifth toenails Ulcer Exam: There is no evidence of ulcer or pre-ulcerative changes or infection. Orthopedic Exam: Muscle tone and strength are WNL. No limitations in general ROM. No crepitus or effusions noted. Foot type and digits show no abnormalities. Bony prominences are unremarkable. Skin: No Porokeratosis. No infection or ulcers  Diagnosis:  Onychomycosis, , Pain in right toe, pain in left toes  Treatment & Plan Procedures and Treatment: Consent by patient was obtained for treatment procedures. The patient understood the discussion of treatment and procedures well. All questions were answered thoroughly reviewed. Debridement of mycotic and hypertrophic toenails, 1 through 5 bilateral and clearing of subungual debris. No ulceration, no infection noted.  Return Visit-Office Procedure: Patient instructed to return to the office for a follow up visit 3 months for continued evaluation and treatment.    Tamara Monteith DPM 

## 2017-02-20 ENCOUNTER — Ambulatory Visit: Payer: No Typology Code available for payment source | Admitting: Podiatry

## 2017-03-03 ENCOUNTER — Ambulatory Visit: Payer: No Typology Code available for payment source | Admitting: Podiatry

## 2017-03-24 ENCOUNTER — Ambulatory Visit: Payer: No Typology Code available for payment source

## 2017-03-24 ENCOUNTER — Ambulatory Visit (INDEPENDENT_AMBULATORY_CARE_PROVIDER_SITE_OTHER): Payer: No Typology Code available for payment source | Admitting: Podiatry

## 2017-03-24 ENCOUNTER — Other Ambulatory Visit: Payer: Self-pay | Admitting: *Deleted

## 2017-03-24 DIAGNOSIS — R52 Pain, unspecified: Secondary | ICD-10-CM | POA: Diagnosis not present

## 2017-03-24 DIAGNOSIS — M79676 Pain in unspecified toe(s): Secondary | ICD-10-CM

## 2017-03-24 DIAGNOSIS — B351 Tinea unguium: Secondary | ICD-10-CM

## 2017-03-24 DIAGNOSIS — E119 Type 2 diabetes mellitus without complications: Secondary | ICD-10-CM

## 2017-03-24 NOTE — Progress Notes (Signed)
Complaint:  Visit Type: Patient returns to my office for continued preventative foot care services. Complaint: Patient states" my nails have grown long and thick and become painful to walk and wear shoes" Patient has been diagnosed with DM with no foot complications. The patient presents for preventative foot care services. No changes to ROS  Podiatric Exam: Vascular: dorsalis pedis and posterior tibial pulses are palpable bilateral. Capillary return is immediate. Temperature gradient is WNL. Skin turgor WNL  Sensorium: Normal Semmes Weinstein monofilament test. Normal tactile sensation bilaterally. Nail Exam: Pt has thick disfigured discolored nails with subungual debris noted bilateral entire nail hallux through fifth toenails Ulcer Exam: There is no evidence of ulcer or pre-ulcerative changes or infection. Orthopedic Exam: Muscle tone and strength are WNL. No limitations in general ROM. No crepitus or effusions noted. Foot type and digits show no abnormalities. Bony prominences are unremarkable. Skin: No Porokeratosis. No infection or ulcers  Diagnosis:  Onychomycosis, , Pain in right toe, pain in left toes  Treatment & Plan Procedures and Treatment: Consent by patient was obtained for treatment procedures. The patient understood the discussion of treatment and procedures well. All questions were answered thoroughly reviewed. Debridement of mycotic and hypertrophic toenails, 1 through 5 bilateral and clearing of subungual debris. No ulceration, no infection noted.  Patient says he has vague pain in both forefeet.  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   Return Visit-Office Procedure: Patient instructed to return to the office for a follow up visit 3 months for continued evaluation and treatment.    Gardiner Barefoot DPM

## 2017-07-01 ENCOUNTER — Ambulatory Visit: Payer: No Typology Code available for payment source | Admitting: Podiatry

## 2019-07-27 ENCOUNTER — Encounter: Payer: Self-pay | Admitting: Podiatry

## 2019-07-27 ENCOUNTER — Other Ambulatory Visit: Payer: Self-pay

## 2019-07-27 ENCOUNTER — Ambulatory Visit: Payer: No Typology Code available for payment source | Admitting: Podiatry

## 2019-07-27 DIAGNOSIS — Q828 Other specified congenital malformations of skin: Secondary | ICD-10-CM | POA: Insufficient documentation

## 2019-07-27 NOTE — Progress Notes (Signed)
This patient presents the office with chief complaint of a painful callus under the ball of his right foot.  He says this is painful walking and wearing his shoes.  Patient has provided no self treatment or sought any professional help.  Patient is diabetic.  He presents the office today for preventative foot care services.  Patient states his nails does not need attemtion   .Vascular  Dorsalis pedis and posterior tibial pulses are palpable  B/L.  Capillary return  WNL.  Temperature gradient is  WNL.  Skin turgor  WNL  Sensorium  Senn Weinstein monofilament wire  WNL. Normal tactile sensation.  Nail Exam  Patient has normal nails with no evidence of bacterial or fungal infection.  Orthopedic  Exam  Muscle tone and muscle strength  WNL.  No limitations of motion feet  B/L.  No crepitus or joint effusion noted.  Hammer toe with dorsiflexion 1-5  B/L.  Bony prominences are unremarkable.  Skin  No open lesions.  Normal skin texture and turgor.  Porokeratosis sub 2 right foot.  Porokeratosis right foot.  ROV.  Debride porokeratosis.  RTC prn.   Gardiner Barefoot DPM

## 2023-02-27 ENCOUNTER — Encounter: Payer: Self-pay | Admitting: Internal Medicine

## 2023-03-19 ENCOUNTER — Telehealth: Payer: Self-pay

## 2023-03-19 NOTE — Telephone Encounter (Signed)
BMI is >50 and does not meet [facility] patient criteria.  Per protocol needs to be scheduled at the hospital.  His pre visit is scheduled for 03/26/23 and will wait to reschedule PV depending on the date for the colonoscopy. Please advise if he needs an office visit or should be a direct to the hospital.

## 2023-03-20 NOTE — Telephone Encounter (Signed)
Called patient and left message that the pre visit and colonoscopy had been cancelled.  Let patient know that Dr. Marvell Fuller nurse would put him on the list and reach out to him in the next couple of months, and if he had any questions, to please call.

## 2023-03-20 NOTE — Telephone Encounter (Signed)
Cancel pre-visit - let him know I will put him on my list and try to schedule in the next couple of months Let me know if he has ?'s

## 2023-03-23 NOTE — Telephone Encounter (Signed)
Pt added to Dr. Leone Payor  wait list for Hospital procedures.

## 2023-04-13 ENCOUNTER — Other Ambulatory Visit (INDEPENDENT_AMBULATORY_CARE_PROVIDER_SITE_OTHER): Payer: Self-pay

## 2023-04-13 ENCOUNTER — Encounter: Payer: Self-pay | Admitting: Orthopaedic Surgery

## 2023-04-13 ENCOUNTER — Ambulatory Visit: Payer: Self-pay | Admitting: Orthopaedic Surgery

## 2023-04-13 DIAGNOSIS — G8929 Other chronic pain: Secondary | ICD-10-CM

## 2023-04-13 DIAGNOSIS — M1712 Unilateral primary osteoarthritis, left knee: Secondary | ICD-10-CM

## 2023-04-13 DIAGNOSIS — M25562 Pain in left knee: Secondary | ICD-10-CM

## 2023-04-13 NOTE — Progress Notes (Signed)
Patient is a very pleasant 62 year old gentleman I am seeing for the first time.  He is referred from Dr. Martha Clan his primary care physician to evaluate and treat left knee pain.  Is been hurting him for about 2 to 3 months now.  He had a couple falls on it as well.  He says it does hurt to bear weight he is a diabetic but reports a low hemoglobin A1c.  His last weight recorded is 408 pounds.  We did not obtain a new weight or BMI today.  He has never had surgery on that knee and has not had an injection either.  There is currently listed no fever, chills, nausea, vomiting   On exam he does have slight varus malalignment of his left knee that is correctable.  He has medial joint line tenderness only.  There is minimal patellofemoral crepitation.  The knee has good range of motion and is ligamentously stable.  2 views left knee show varus malalignment with significant medial compartment arthritis.  We did talk about weight loss and quad strengthening exercises.  I did recommend a steroid injection in his knee and he agreed to this and tolerated it well.  He may be a good candidate later for hyaluronic acid for the knee.  All questions and concerns were answered and addressed.  I would like to see him back in 4 weeks to see how he is doing overall.  I would like a weight and BMI calculation at that visit.

## 2023-04-16 ENCOUNTER — Encounter: Payer: Self-pay | Admitting: Internal Medicine

## 2023-05-18 ENCOUNTER — Ambulatory Visit (INDEPENDENT_AMBULATORY_CARE_PROVIDER_SITE_OTHER): Payer: 59 | Admitting: Orthopaedic Surgery

## 2023-05-18 ENCOUNTER — Encounter: Payer: Self-pay | Admitting: Orthopaedic Surgery

## 2023-05-18 VITALS — Ht 69.0 in | Wt >= 6400 oz

## 2023-05-18 DIAGNOSIS — M1712 Unilateral primary osteoarthritis, left knee: Secondary | ICD-10-CM

## 2023-05-18 NOTE — Progress Notes (Signed)
The patient is seen in follow-up today for moderate left knee arthritis.  A steroid injection did help temporize some of this pain.  We are looking for hopefully long-term treatment with considering him a candidate for hyaluronic acid.  He is someone who weighs 4 and 10 pounds with a BMI of 60.55.  On exam there is no effusion today.  His pain is certainly less.  His knee has varus malalignment that is correctable.  There are some patellofemoral crepitation.  He does feel a clicking sensation sometimes late at night in bed.  At this point I would like to order hyaluronic acid for his left knee to treat the pain from long-term osteoarthritis.  As always we continue to encourage weight loss.  We will see him back hopefully once we have that injection approved and ordered for his left knee.  He agrees with this treatment plan.  This patient is diagnosed with osteoarthritis of the knee(s).    Radiographs show evidence of joint space narrowing, osteophytes, subchondral sclerosis and/or subchondral cysts.  This patient has knee pain which interferes with functional and activities of daily living.    This patient has experienced inadequate response, adverse effects and/or intolerance with conservative treatments such as acetaminophen, NSAIDS, topical creams, physical therapy or regular exercise, knee bracing and/or weight loss.   This patient has experienced inadequate response or has a contraindication to intra articular steroid injections for at least 3 months.   This patient is not scheduled to have a total knee replacement within 6 months of starting treatment with viscosupplementation.

## 2023-05-19 ENCOUNTER — Telehealth: Payer: Self-pay

## 2023-05-19 NOTE — Telephone Encounter (Signed)
Left knee gel  

## 2023-05-21 NOTE — Telephone Encounter (Signed)
VOB submitted for Durolane, left knee.

## 2023-07-03 ENCOUNTER — Telehealth: Payer: Self-pay

## 2023-07-03 NOTE — Telephone Encounter (Addendum)
Left message for pt to call back.   ( Pt needs to be scheduled for a colonoscopy at [facility] on 09/07/2023)  Screening for Colon Cancer/ BMI greater that 50.

## 2023-07-06 ENCOUNTER — Other Ambulatory Visit: Payer: Self-pay

## 2023-07-06 DIAGNOSIS — Z1211 Encounter for screening for malignant neoplasm of colon: Secondary | ICD-10-CM

## 2023-07-06 NOTE — Telephone Encounter (Signed)
Pt was scheduled fro a Colonoscopy with Dr. Leone Payor on 09/07/2023 at 0730 AM: Case ID [id number] Pt verbalized understanding with all questions answered.

## 2023-07-07 NOTE — Telephone Encounter (Signed)
Pt was notified that he  was scheduled for  a Colonoscopy with Dr. Leone Payor on 09/07/2023 at 0730 AM at [facility] Case ID [id number]. Pt made aware Pt was scheduled for a previsit appointment on 08/14/2023 at 11:00 AM. Pt made aware.  Pt verbalized understanding with all questions answered.

## 2023-08-10 ENCOUNTER — Telehealth: Payer: Self-pay | Admitting: *Deleted

## 2023-08-10 NOTE — Telephone Encounter (Signed)
Yesi,  This pt's BMI is greater than 50; their procedure will need to be performed at the hospital.  Thanks,  John Nulty 

## 2023-08-14 ENCOUNTER — Telehealth: Payer: Self-pay | Admitting: *Deleted

## 2023-08-14 NOTE — Telephone Encounter (Signed)
Attempt to reach pt for pre-visit. LM with call back #.  Will attempt to reach again in 5 min due to no other # listed in profile  Second attempt to reach pt for pre-vist unsuccessful. LM with facility # for pt to call back. Instructed pt to call # given by end of the day and reschedule the pre-visit  with RN or the scheduled procedure will be canceled.

## 2023-08-17 ENCOUNTER — Telehealth: Payer: Self-pay | Admitting: *Deleted

## 2023-08-17 NOTE — Telephone Encounter (Signed)
Left message for pt to call back  °

## 2023-08-17 NOTE — Telephone Encounter (Signed)
Pt was scheduled for a previsit phone interview on 08/21/2023 at 1:00 PM. Pt made aware.  Pt verbalized understanding with all questions answered.

## 2023-08-18 ENCOUNTER — Telehealth: Payer: Self-pay | Admitting: *Deleted

## 2023-08-18 NOTE — Telephone Encounter (Signed)
Yesi,  This pt's BMI is greater than 50; their procedure will need to be performed at the hospital.  Thanks,  John Nulty 

## 2023-08-21 ENCOUNTER — Ambulatory Visit (AMBULATORY_SURGERY_CENTER): Payer: 59 | Admitting: *Deleted

## 2023-08-21 VITALS — Ht 69.0 in | Wt >= 6400 oz

## 2023-08-21 DIAGNOSIS — Z1211 Encounter for screening for malignant neoplasm of colon: Secondary | ICD-10-CM

## 2023-08-21 MED ORDER — NA SULFATE-K SULFATE-MG SULF 17.5-3.13-1.6 GM/177ML PO SOLN: 1.0000 | Freq: Once | ORAL | 0 refills | Status: AC

## 2023-08-21 NOTE — Progress Notes (Signed)
Pt's name and DOB verified at the beginning of the pre-visit wit 2 identifiers  Pt denies any difficulty with ambulating,sitting, laying down or rolling side to side  Pt has issues with ambulation   Pt has no issues moving head neck or swallowing  No egg or soy allergy known to patient   No issues known to pt with past sedation with any surgeries or procedures  Pt denies having issues being intubated  No FH of Malignant Hyperthermia  Pt is not on diet pills or shots  Pt is not on home 02   Pt is not on blood thinners   Pt denies issues with constipation   Pt is not on dialysis  Pt denise any abnormal heart rhythms   Pt denies any upcoming cardiac testing  Pt encouraged to use to use Singlecare or Goodrx to reduce cost   Patient's chart reviewed by Cathlyn Parsons CNRA prior to pre-visit and patient appropriate for the [facility].  Pre-visit completed and red dot placed by patient's name on their procedure day (on provider's schedule).  .  Visit by phone  Pt states weight is 400 lb   Instructed pt why it is important to and  to call if they have any changes in health or new medications. Directed them to the # given and on instructions.     Instructions reviewed. Pt given both [facility] main # and MD on call # prior to instructions.  Pt states understanding. Instructed to review again prior to procedure. Pt states they will.   Instructions sent by mail with coupon and by My Chart   Coupon sent via text to mobile phone and pt verified they received it

## 2023-08-24 ENCOUNTER — Encounter: Payer: Self-pay | Admitting: Internal Medicine

## 2023-08-28 ENCOUNTER — Encounter (HOSPITAL_COMMUNITY): Payer: Self-pay | Admitting: Internal Medicine

## 2023-09-02 ENCOUNTER — Telehealth: Payer: Self-pay | Admitting: Internal Medicine

## 2023-09-02 NOTE — Telephone Encounter (Signed)
Patient called stated he has a slight cough no fever feels fine and also said his PCP has him on new medication started Sunday. Would like to know if he can still proceed with hospital procedure.

## 2023-09-02 NOTE — Telephone Encounter (Signed)
Pt stated that he has has a slight cough for about a week. No other symptoms.  No fever, No congestion. Pt stated that he has not stated any medication for it although he was recently prescribed something to help him sleep. Pt was notified to proceed with the scheduled procedure but to call the office if he worsens. Pt verbalized understanding with all questions answered.

## 2023-09-07 ENCOUNTER — Other Ambulatory Visit: Payer: Self-pay

## 2023-09-07 ENCOUNTER — Ambulatory Visit (HOSPITAL_COMMUNITY)
Admission: RE | Admit: 2023-09-07 | Discharge: 2023-09-07 | Disposition: A | Discharge status: Home / Self Care | Payer: 59 | Attending: Internal Medicine | Admitting: Internal Medicine

## 2023-09-07 ENCOUNTER — Ambulatory Visit (HOSPITAL_COMMUNITY): Payer: 59 | Admitting: Certified Registered"

## 2023-09-07 ENCOUNTER — Encounter (HOSPITAL_COMMUNITY): Payer: Self-pay | Admitting: Internal Medicine

## 2023-09-07 ENCOUNTER — Ambulatory Visit (HOSPITAL_BASED_OUTPATIENT_CLINIC_OR_DEPARTMENT_OTHER): Payer: 59 | Admitting: Certified Registered"

## 2023-09-07 ENCOUNTER — Encounter (HOSPITAL_COMMUNITY)
Admission: RE | Disposition: A | Discharge status: Home / Self Care | Payer: Self-pay | Source: Home / Self Care | Attending: Internal Medicine

## 2023-09-07 DIAGNOSIS — G4730 Sleep apnea, unspecified: Secondary | ICD-10-CM | POA: Insufficient documentation

## 2023-09-07 DIAGNOSIS — E119 Type 2 diabetes mellitus without complications: Secondary | ICD-10-CM | POA: Diagnosis not present

## 2023-09-07 DIAGNOSIS — Z83719 Family history of colon polyps, unspecified: Secondary | ICD-10-CM | POA: Insufficient documentation

## 2023-09-07 DIAGNOSIS — D128 Benign neoplasm of rectum: Secondary | ICD-10-CM | POA: Diagnosis not present

## 2023-09-07 DIAGNOSIS — Z7984 Long term (current) use of oral hypoglycemic drugs: Secondary | ICD-10-CM | POA: Insufficient documentation

## 2023-09-07 DIAGNOSIS — I10 Essential (primary) hypertension: Secondary | ICD-10-CM | POA: Diagnosis not present

## 2023-09-07 DIAGNOSIS — Z6841 Body Mass Index (BMI) 40.0 and over, adult: Secondary | ICD-10-CM | POA: Diagnosis not present

## 2023-09-07 DIAGNOSIS — Z1211 Encounter for screening for malignant neoplasm of colon: Secondary | ICD-10-CM

## 2023-09-07 HISTORY — PX: POLYPECTOMY: SHX5525

## 2023-09-07 HISTORY — PX: COLONOSCOPY WITH PROPOFOL: SHX5780

## 2023-09-07 LAB — GLUCOSE, CAPILLARY: Glucose-Capillary: 84 mg/dL (ref 70–99)

## 2023-09-07 SURGERY — COLONOSCOPY WITH PROPOFOL
Anesthesia: Monitor Anesthesia Care

## 2023-09-07 MED ORDER — PROPOFOL 500 MG/50ML IV EMUL
INTRAVENOUS | Status: DC | PRN
  Administered 2023-09-07: 125 ug/kg/min via INTRAVENOUS

## 2023-09-07 MED ORDER — PROPOFOL 500 MG/50ML IV EMUL
INTRAVENOUS | Status: AC
  Filled 2023-09-07: qty 50

## 2023-09-07 MED ORDER — LIDOCAINE 2% (20 MG/ML) 5 ML SYRINGE
INTRAMUSCULAR | Status: DC | PRN
  Administered 2023-09-07: 100 mg via INTRAVENOUS

## 2023-09-07 MED ORDER — SODIUM CHLORIDE 0.9 % IV SOLN
INTRAVENOUS | Status: DC
Start: 2023-09-07 — End: 2023-09-07

## 2023-09-07 MED ORDER — PROPOFOL 1000 MG/100ML IV EMUL
INTRAVENOUS | Status: AC
  Filled 2023-09-07: qty 100

## 2023-09-07 MED ORDER — PHENYLEPHRINE 80 MCG/ML (10ML) SYRINGE FOR IV PUSH (FOR BLOOD PRESSURE SUPPORT)
PREFILLED_SYRINGE | INTRAVENOUS | Status: DC | PRN
  Administered 2023-09-07 (×3): 80 ug via INTRAVENOUS

## 2023-09-07 MED ORDER — PROPOFOL 10 MG/ML IV BOLUS
INTRAVENOUS | Status: DC | PRN
  Administered 2023-09-07 (×3): 20 mg via INTRAVENOUS

## 2023-09-07 SURGICAL SUPPLY — 20 items
ELECT REM PT RETURN 9FT ADLT (ELECTROSURGICAL)
ELECTRODE REM PT RTRN 9FT ADLT (ELECTROSURGICAL) IMPLANT
FCP BXJMBJMB 240X2.8X (CUTTING FORCEPS)
FLOOR PAD 36X40 (MISCELLANEOUS) ×2
FORCEPS BIOP RAD 4 LRG CAP 4 (CUTTING FORCEPS) IMPLANT
FORCEPS BXJMBJMB 240X2.8X (CUTTING FORCEPS) IMPLANT
INJECTOR/SNARE I SNARE (MISCELLANEOUS) IMPLANT
LUBRICANT JELLY 4.5OZ STERILE (MISCELLANEOUS) IMPLANT
MANIFOLD NEPTUNE II (INSTRUMENTS) IMPLANT
NDL SCLEROTHERAPY 25GX240 (NEEDLE) IMPLANT
NEEDLE SCLEROTHERAPY 25GX240 (NEEDLE)
PAD FLOOR 36X40 (MISCELLANEOUS) ×3 IMPLANT
PROBE APC STR FIRE (PROBE) IMPLANT
PROBE INJECTION GOLD 7FR (MISCELLANEOUS) IMPLANT
SNARE ROTATE MED OVAL 20MM (MISCELLANEOUS) IMPLANT
SYR 50ML LL SCALE MARK (SYRINGE) IMPLANT
TRAP SPECIMEN MUCOUS 40CC (MISCELLANEOUS) IMPLANT
TUBING ENDO SMARTCAP PENTAX (MISCELLANEOUS) IMPLANT
TUBING IRRIGATION ENDOGATOR (MISCELLANEOUS) ×3 IMPLANT
WATER STERILE IRR 1000ML POUR (IV SOLUTION) IMPLANT

## 2023-09-07 NOTE — Anesthesia Postprocedure Evaluation (Signed)
Anesthesia Post Note  Patient: Richard Serrano  Procedure(s) Performed: COLONOSCOPY WITH PROPOFOL POLYPECTOMY     Patient location during evaluation: PACU Anesthesia Type: MAC Level of consciousness: awake and alert Pain management: pain level controlled Vital Signs Assessment: post-procedure vital signs reviewed and stable Respiratory status: spontaneous breathing, nonlabored ventilation, respiratory function stable and patient connected to nasal cannula oxygen Cardiovascular status: stable and blood pressure returned to baseline Postop Assessment: no apparent nausea or vomiting Anesthetic complications: no   No notable events documented.  Last Vitals:  Vitals:   09/07/23 0820 09/07/23 0830  BP: (!) 104/51 (!) (P) 115/42  Pulse: 88 82  Resp: 18 18  Temp:    SpO2: 99% 99%    Last Pain:  Vitals:   09/07/23 0830  TempSrc:   PainSc: (P) 0-No pain                 Earl Lites P Cherice Glennie

## 2023-09-07 NOTE — Transfer of Care (Signed)
Immediate Anesthesia Transfer of Care Note  Patient: Richard Serrano  Procedure(s) Performed: COLONOSCOPY WITH PROPOFOL POLYPECTOMY  Patient Location: PACU  Anesthesia Type:MAC  Level of Consciousness: awake, alert , and patient cooperative  Airway & Oxygen Therapy: Patient Spontanous Breathing and Patient connected to face mask oxygen  Post-op Assessment: Report given to RN and Post -op Vital signs reviewed and stable  Post vital signs: Reviewed and stable  Last Vitals:  Vitals Value Taken Time  BP 90/50 09/07/23 0806  Temp    Pulse 85 09/07/23 0807  Resp 19 09/07/23 0807  SpO2 98 % 09/07/23 0807  Vitals shown include unfiled device data.  Last Pain:  Vitals:   09/07/23 0639  TempSrc: Tympanic  PainSc: 0-No pain         Complications: No notable events documented.

## 2023-09-07 NOTE — Progress Notes (Signed)
Documented in error.

## 2023-09-07 NOTE — Discharge Instructions (Addendum)
I found and removed one polyp that looks benign though I suspect it is pre-cancerous.  I will let you know pathology results and when to have another routine colonoscopy by mail and/or My Chart.  All else normal.  I removed medications that you indicated you were not taking anymore. It looked like it had been a long time since you were using these.  I appreciate the opportunity to care for you. Iva Boop, MD, FACG  YOU HAD AN ENDOSCOPIC PROCEDURE TODAY: Refer to the procedure report and other information in the discharge instructions given to you for any specific questions about what was found during the examination. If this information does not answer your questions, please call Dr. Marvell Fuller office at [phone] to clarify.   YOU SHOULD EXPECT: Some feelings of bloating in the abdomen. Passage of more gas than usual. Walking can help get rid of the air that was put into your GI tract during the procedure and reduce the bloating. If you had a lower endoscopy (such as a colonoscopy or flexible sigmoidoscopy) you may notice spotting of blood in your stool or on the toilet paper. Some abdominal soreness may be present for a day or two, also.  DIET: Your first meal following the procedure should be a light meal and then it is ok to progress to your normal diet. A half-sandwich or bowl of soup is an example of a good first meal. Heavy or fried foods are harder to digest and may make you feel nauseous or bloated. Drink plenty of fluids but you should avoid alcoholic beverages for 24 hours.   ACTIVITY: Your care partner should take you home directly after the procedure. You should plan to take it easy, moving slowly for the rest of the day. You can resume normal activity the day after the procedure however YOU SHOULD NOT DRIVE, use power tools, machinery or perform tasks that involve climbing or major physical exertion for 24 hours (because of the sedation medicines used during the test).    SYMPTOMS TO REPORT IMMEDIATELY: A gastroenterologist can be reached at any hour. Please call [phone]  for any of the following symptoms:  Following lower endoscopy (colonoscopy, flexible sigmoidoscopy) Excessive amounts of blood in the stool  Significant tenderness, worsening of abdominal pains  Swelling of the abdomen that is new, acute  Fever of 100 or higher  Following upper endoscopy (EGD, EUS, ERCP, esophageal dilation) Vomiting of blood or coffee ground material  New, significant abdominal pain  New, significant chest pain or pain under the shoulder blades  Painful or persistently difficult swallowing  New shortness of breath  Black, tarry-looking or red, bloody stools  FOLLOW UP:  If any biopsies were taken you will be contacted by phone or by letter within the next 1-3 weeks. Call [phone]  if you have not heard about the biopsies in 3 weeks.  Please also call with any specific questions about appointments or follow up tests.

## 2023-09-07 NOTE — Op Note (Addendum)
[facility] Patient Name: Richard Serrano Procedure Date: 09/07/2023 MRN: [id number] Attending MD: Iva Boop , MD, [id number] Date of Birth: Apr 09, 1961 CSN: [id number] Age: 62 Admit Type: Outpatient Procedure:                Colonoscopy Indications:              Screening for colorectal malignant neoplasm, Last                            colonoscopy: 2014 Providers:                Iva Boop, MD, Rogue Jury, RN, Norman Clay,                            RN, Rozetta Nunnery, Technician Referring MD:              Medicines:                Monitored Anesthesia Care Complications:            No immediate complications. Estimated Blood Loss:     Estimated blood loss was minimal. Procedure:                Pre-Anesthesia Assessment:                           - Prior to the procedure, a History and Physical                            was performed, and patient medications and                            allergies were reviewed. The patient's tolerance of                            previous anesthesia was also reviewed. The risks                            and benefits of the procedure and the sedation                            options and risks were discussed with the patient.                            All questions were answered, and informed consent                            was obtained. Prior Anticoagulants: The patient has                            taken no anticoagulant or antiplatelet agents. ASA                            Grade Assessment: III - A patient with severe  systemic disease. After reviewing the risks and                            benefits, the patient was deemed in satisfactory                            condition to undergo the procedure.                           After obtaining informed consent, the colonoscope                            was passed under direct vision. Throughout the                             procedure, the patient's blood pressure, pulse, and                            oxygen saturations were monitored continuously. The                            CF-HQ190L ([id number] Olympus colonoscope was                            introduced through the anus and advanced to the the                            cecum, identified by appendiceal orifice and                            ileocecal valve. The patient tolerated the                            procedure well. The colonoscopy was somewhat                            difficult due to significant looping. Successful                            completion of the procedure was aided by applying                            abdominal pressure. The patient tolerated the                            procedure well. The quality of the bowel                            preparation was excellent. The ileocecal valve,                            appendiceal orifice, and rectum were photographed.  The bowel preparation used was SUPREP via split                            dose instruction. Scope In: 7:40:21 AM Scope Out: 7:55:53 AM Scope Withdrawal Time: 0 hours 10 minutes 0 seconds  Total Procedure Duration: 0 hours 15 minutes 32 seconds  Findings:      The perianal and digital rectal examinations were normal.      A 10 to 12 mm polyp was found in the rectum. The polyp was sessile. The       polyp was removed with a cold snare. Resection and retrieval were       complete. Verification of patient identification for the specimen was       done. Estimated blood loss was minimal.      The exam was otherwise without abnormality on direct and retroflexion       views. Impression:               - One 10 to 12 mm polyp in the rectum, removed with                            a cold snare. Resected and retrieved.                           - The examination was otherwise normal on direct                            and retroflexion  views. Moderate Sedation:      Not Applicable - Patient had care per Anesthesia. Recommendation:           - Patient has a contact number available for                            emergencies. The signs and symptoms of potential                            delayed complications were discussed with the                            patient. Return to normal activities tomorrow.                            Written discharge instructions were provided to the                            patient.                           - Resume previous diet.                           - Continue present medications.                           - Repeat colonoscopy is recommended. The  colonoscopy date will be determined after pathology                            results from today's exam become available for                            review. Procedure Code(s):        --- Professional ---                           [phone], Colonoscopy, flexible; with removal of                            tumor(s), polyp(s), or other lesion(s) by snare                            technique Diagnosis Code(s):        --- Professional ---                           Z12.11, Encounter for screening for malignant                            neoplasm of colon                           D12.8, Benign neoplasm of rectum CPT copyright 2022 [employer]. All rights reserved. The codes documented in this report are preliminary and upon coder review may  be revised to meet current compliance requirements. Iva Boop, MD 09/07/2023 8:08:43 AM This report has been signed electronically. Number of Addenda: 0

## 2023-09-07 NOTE — Anesthesia Preprocedure Evaluation (Signed)
Anesthesia Evaluation  Patient identified by MRN, date of birth, ID band Patient awake    Reviewed: Allergy & Precautions, NPO status , Patient's Chart, lab work & pertinent test results  Airway Mallampati: III  TM Distance: >3 FB Neck ROM: Full    Dental no notable dental hx.    Pulmonary sleep apnea and Continuous Positive Airway Pressure Ventilation    Pulmonary exam normal        Cardiovascular hypertension, Pt. on medications  Rhythm:Regular Rate:Normal     Neuro/Psych    Depression    negative neurological ROS     GI/Hepatic negative GI ROS, Neg liver ROS,,,  Endo/Other  diabetes, Type 2, Oral Hypoglycemic Agents  Class 4 obesity  Renal/GU   negative genitourinary   Musculoskeletal  (+) Arthritis , Osteoarthritis,    Abdominal Normal abdominal exam  (+)   Peds  Hematology   Anesthesia Other Findings   Reproductive/Obstetrics                             Anesthesia Physical Anesthesia Plan  ASA: 3  Anesthesia Plan: MAC   Post-op Pain Management:    Induction: Intravenous  PONV Risk Score and Plan: 1 and Propofol infusion and Treatment may vary due to age or medical condition  Airway Management Planned: Simple Face Mask and Nasal Cannula  Additional Equipment: None  Intra-op Plan:   Post-operative Plan:   Informed Consent: I have reviewed the patients History and Physical, chart, labs and discussed the procedure including the risks, benefits and alternatives for the proposed anesthesia with the patient or authorized representative who has indicated his/her understanding and acceptance.     Dental advisory given  Plan Discussed with: CRNA  Anesthesia Plan Comments:        Anesthesia Quick Evaluation

## 2023-09-07 NOTE — Anesthesia Procedure Notes (Signed)
Procedure Name: MAC Date/Time: 09/07/2023 7:38 AM  Performed by: Sindy Guadeloupe, CRNAPre-anesthesia Checklist: Patient identified, Emergency Drugs available, Suction available, Patient being monitored and Timeout performed Oxygen Delivery Method: Supernova nasal CPAP Placement Confirmation: positive ETCO2

## 2023-09-07 NOTE — H&P (Signed)
[facility] History and Physical   Primary Care Physician:  Cleatis Polka., MD   Reason for Procedure:   CRCa screening  Plan:    colonoscopy     HPI: Richard Serrano is a 62 y.o. 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 Authorizing Provider  Ascorbic Acid (VITAMIN C) 1000 MG tablet Take 1,000 mg by mouth daily.   Yes [provider]  aspirin 81 MG tablet Take 81 mg by mouth daily.   Yes [provider]  atorvastatin (LIPITOR) 40 MG tablet Take 40 mg by mouth daily.   Yes [provider]  Cyanocobalamin (B-12) 50 MCG TABS Take 1 tablet by mouth daily. 07/11/09  Yes [provider]  docusate sodium (COLACE) 100 MG capsule Take 100 mg by mouth 2 (two) times daily.   Yes [provider]  doxazosin (CARDURA) 4 MG tablet Take 4 mg by mouth daily.   Yes [provider]  escitalopram (LEXAPRO) 10 MG tablet Take 2 tablets (20 mg total) by mouth daily. One in AM po 06/25/16  Yes Dohmeier, Porfirio Mylar, MD  finasteride (PROSCAR) 5 MG tablet Take 5 mg by mouth daily.   Yes [provider]  glucose blood (ONETOUCH VERIO) test strip Use to self monitor blood glucose daily and as needed In Vitro 11/21/15  Yes [provider]  lisinopril (PRINIVIL,ZESTRIL) 20 MG tablet Take 20 mg by mouth daily.    Yes [provider]  metFORMIN (GLUCOPHAGE) 1000 MG tablet Take 500 mg by mouth daily at 6 (six) AM.   Yes [provider]  Multiple Vitamin (MULTIVITAMIN) tablet Take 1 tablet by mouth daily.   Yes [provider]  Omega-3 Fatty Acids (FISH OIL) 1200 MG CAPS Take 1 capsule by mouth daily.   Yes [provider]  Turmeric (QC TUMERIC COMPLEX PO) Take by mouth daily at 6 (six) AM. 1000   Yes [provider]  diazepam (VALIUM) 2 MG tablet One at 9 Pm by mouth. Patient not taking: Reported on 08/21/2023 06/25/16   Dohmeier, Porfirio Mylar, MD  diltiazem 2 % GEL Apply 1 application topically 2 (two) times daily. Small amount to anal area Patient not taking: Reported on 08/21/2023 01/26/13   Iva Boop, MD  [facility] 7.5 MG/0.5ML Pen SMARTSIG:7.5 Milligram(s) SUB-Q Once a Week 07/13/23   [provider]  Phentermine-Topiramate (QSYMIA) 11.25-69 MG CP24 Take 1 tablet by mouth daily. Patient not taking: Reported on 08/21/2023    [provider]  Psyllium (METAMUCIL PO) Take 1 each by mouth daily. Patient not taking: Reported on 08/21/2023    [provider]    Current Facility-Administered Medications  Medication Dose Route Frequency Provider Last Rate Last Admin   0.9 %  sodium chloride infusion   Intravenous  Continuous Iva Boop, MD        Allergies as of 07/06/2023   (No Known Allergies)    Family History  Problem Relation Age of Onset   Colon polyps Mother    Colon cancer Neg Hx    Esophageal cancer Neg Hx    Rectal cancer Neg Hx    Stomach cancer Neg Hx     Social History   Socioeconomic History   Marital status: Married    Spouse name: Not on file   Number of children: Not on file   Years of education: Not on file   Highest education level: Not on file  Occupational History   Not on file  Tobacco Use   Smoking status: Never   Smokeless tobacco: Never  Substance and Sexual Activity   Alcohol use: Yes     Comment: 4-5 drinks weekends.   Drug use: No   Sexual activity: Not on file  Other Topics Concern   Not on file  Social History Narrative   Not on file   Social Determinants of Health   Financial Resource Strain: Not on file  Food Insecurity: Not on file  Transportation Needs: Not on file  Physical Activity: Not on file  Stress: Not on file  Social Connections: Not on file  Intimate Partner Violence: Not on file    Review of Systems All other review of systems negative except as mentioned in the HPI.  Physical Exam: Vital signs BP 124/69   Pulse 82   Temp 98.1 F (36.7 C) (Tympanic)   Resp 10   Ht 5\' 9"  (1.753 m)   Wt (!) 181.4 kg   SpO2 96%   BMI 59.07 kg/m   General:   Alert,  Well-developed, well-nourished, pleasant and cooperative in NAD - obese Lungs:  Clear throughout to auscultation.   Heart:  Regular rate and rhythm; no murmurs, clicks, rubs,  or gallops. Abdomen:  obese Soft, nontender and nondistended. Normal bowel sounds.   Neuro/Psych:  Alert and cooperative. Normal mood and affect. A and O x 3   @Imo Cumbie  Sena Slate, MD, [facility] [phone] (pager) 09/07/2023 7:21 AM@

## 2023-09-08 LAB — SURGICAL PATHOLOGY

## 2023-09-09 ENCOUNTER — Encounter (HOSPITAL_COMMUNITY): Payer: Self-pay | Admitting: Internal Medicine

## 2023-09-10 ENCOUNTER — Encounter: Payer: Self-pay | Admitting: Internal Medicine

## 2023-09-10 DIAGNOSIS — Z860101 Personal history of adenomatous and serrated colon polyps: Secondary | ICD-10-CM

## 2023-09-10 HISTORY — DX: Personal history of adenomatous and serrated colon polyps: Z86.0101

## 2024-04-06 ENCOUNTER — Ambulatory Visit: Admitting: Orthopaedic Surgery

## 2024-04-06 VITALS — Wt 391.0 lb

## 2024-04-06 DIAGNOSIS — M25561 Pain in right knee: Secondary | ICD-10-CM | POA: Diagnosis not present

## 2024-04-06 DIAGNOSIS — M25562 Pain in left knee: Secondary | ICD-10-CM

## 2024-04-06 DIAGNOSIS — G8929 Other chronic pain: Secondary | ICD-10-CM | POA: Diagnosis not present

## 2024-04-06 MED ORDER — METHYLPREDNISOLONE ACETATE 40 MG/ML IJ SUSP
40.0000 mg | INTRAMUSCULAR | Status: AC | PRN
  Administered 2024-04-06: 40 mg via INTRA_ARTICULAR

## 2024-04-06 MED ORDER — LIDOCAINE HCL 1 % IJ SOLN
3.0000 mL | INTRAMUSCULAR | Status: AC | PRN
  Administered 2024-04-06: 3 mL

## 2024-04-06 NOTE — Progress Notes (Signed)
 The patient is a 63 year old gentleman who comes in today with bilateral knee pain.  We have seen him before and he does have significant arthritis of his his left knee and likely on his right knee as well.  He does see Dr. Loreli his primary care physician and your recent physical and everything seem to be doing well.  His main comorbidity is his morbid obesity.  His BMI today is 57.74.  It has been a year since we placed a steroid injection in his knees.  He is requesting that today.  He is only borderline diabetic and his hemoglobin A1c recently was 5.7.  He tries to be active and needs work on weight loss.  Examination both knees shows no significant effusion.  He did say his right knee really woken up pretty significantly overnight a few weeks ago when he could not put weight on the knee and it was in the back of the knee but that is calm down.  It has been the right knee this will hurt in the worse recently.  Both knees have slight varus malalignment with no effusion but good range of motion.  Both knees have painful range of motion.  I did place a steroid injection in both knees today which he tolerated well.  He knows to wait at least 3 months between steroid injections at a minimum.  We still advocate weight loss for him as well which can help his knees.    Procedure Note  Patient: Richard Serrano             Date of Birth: 06/15/1961           MRN: [id number]             Visit Date: 04/06/2024  Procedures: Visit Diagnoses:  1. Chronic pain of left knee   2. Chronic pain of right knee     Large Joint Inj: R knee on 04/06/2024 10:01 AM Indications: diagnostic evaluation and pain Details: 22 G 1.5 in needle, superolateral approach  Arthrogram: No  Medications: 3 mL lidocaine  1 %; 40 mg methylPREDNISolone  acetate 40 MG/ML Outcome: tolerated well, no immediate complications Procedure, treatment alternatives, risks and benefits explained, specific risks discussed. Consent was given by the  patient. Immediately prior to procedure a time out was called to verify the correct patient, procedure, equipment, support staff and site/side marked as required. Patient was prepped and draped in the usual sterile fashion.    Large Joint Inj: L knee on 04/06/2024 10:01 AM Indications: diagnostic evaluation and pain Details: 22 G 1.5 in needle, superolateral approach  Arthrogram: No  Medications: 3 mL lidocaine  1 %; 40 mg methylPREDNISolone  acetate 40 MG/ML Outcome: tolerated well, no immediate complications Procedure, treatment alternatives, risks and benefits explained, specific risks discussed. Consent was given by the patient. Immediately prior to procedure a time out was called to verify the correct patient, procedure, equipment, support staff and site/side marked as required. Patient was prepped and draped in the usual sterile fashion.

## 2024-06-22 ENCOUNTER — Ambulatory Visit: Admitting: Neurology

## 2024-06-22 ENCOUNTER — Encounter: Payer: Self-pay | Admitting: Neurology

## 2024-06-22 VITALS — BP 110/80 | HR 82 | Ht 69.0 in | Wt 375.4 lb

## 2024-06-22 DIAGNOSIS — R5382 Chronic fatigue, unspecified: Secondary | ICD-10-CM | POA: Diagnosis not present

## 2024-06-22 DIAGNOSIS — Z6841 Body Mass Index (BMI) 40.0 and over, adult: Secondary | ICD-10-CM | POA: Insufficient documentation

## 2024-06-22 DIAGNOSIS — E662 Morbid (severe) obesity with alveolar hypoventilation: Secondary | ICD-10-CM | POA: Insufficient documentation

## 2024-06-22 DIAGNOSIS — Z72821 Inadequate sleep hygiene: Secondary | ICD-10-CM | POA: Insufficient documentation

## 2024-06-22 DIAGNOSIS — L4050 Arthropathic psoriasis, unspecified: Secondary | ICD-10-CM | POA: Insufficient documentation

## 2024-06-22 DIAGNOSIS — Z7984 Long term (current) use of oral hypoglycemic drugs: Secondary | ICD-10-CM

## 2024-06-22 DIAGNOSIS — R351 Nocturia: Secondary | ICD-10-CM | POA: Insufficient documentation

## 2024-06-22 DIAGNOSIS — F5104 Psychophysiologic insomnia: Secondary | ICD-10-CM | POA: Insufficient documentation

## 2024-06-22 DIAGNOSIS — E118 Type 2 diabetes mellitus with unspecified complications: Secondary | ICD-10-CM | POA: Insufficient documentation

## 2024-06-22 DIAGNOSIS — E11621 Type 2 diabetes mellitus with foot ulcer: Secondary | ICD-10-CM | POA: Insufficient documentation

## 2024-06-22 DIAGNOSIS — G478 Other sleep disorders: Secondary | ICD-10-CM | POA: Diagnosis not present

## 2024-06-22 DIAGNOSIS — G4733 Obstructive sleep apnea (adult) (pediatric): Secondary | ICD-10-CM | POA: Diagnosis not present

## 2024-06-22 MED ORDER — ALPRAZOLAM 0.5 MG PO TABS: 0.5000 mg | ORAL_TABLET | Freq: Every evening | ORAL | 0 refills | Status: DC | PRN

## 2024-06-22 NOTE — Patient Instructions (Signed)
 Living With Sleep Apnea Sleep apnea is a condition that affects your breathing while you're sleeping. Your tongue or the tissue in your throat may block the flow of air while you sleep. You may have shallow breathing or stop breathing for short periods of time. The breaks in breathing interrupt the deep sleep that you need to feel rested. Even if you don't wake up from the gaps in breathing, you may feel tired during the day. People with sleep apnea may snore loudly. You may have a headache in the morning and feel anxious or depressed. How can sleep apnea affect me? Sleep apnea increases your chances of being very tired during the day. This is called daytime fatigue. Sleep apnea can also increase your risk of: Heart attack. Stroke. Obesity. Type 2 diabetes. Heart failure. Irregular heartbeat. High blood pressure. If you are very tired during the day, you may be more likely to: Not do well in school or at work. Fall asleep while driving. Have trouble paying attention. Develop depression or anxiety. Have problems having sex. This is called sexual dysfunction. What actions can I take to manage sleep apnea? Sleep apnea treatment  If you were given a device to open your airway while you sleep, use it only as told by your health care provider. You may be given: An oral appliance. This is a mouthpiece that shifts your lower jaw forward. A continuous positive airway pressure (CPAP) device. This blows air through a mask. A nasal expiratory positive airway pressure (EPAP) device. This has valves that you put into each nostril. A bi-level positive airway pressure (BIPAP) device. This blows air through a mask when you breathe in and breathe out. You may need surgery if other treatments don't work for you. Sleep habits Go to sleep and wake up at the same time every day. This helps set your internal clock for sleeping. If you stay up later than usual on weekends, try to get up in the morning within 2  hours of the time you usually wake up. Try to get at least 7-9 hours of sleep each night. Stop using a computer, tablet, and mobile phone a few hours before bedtime. Do not take long naps during the day. If you nap, limit it to 30 minutes. Have a relaxing bedtime routine. Reading or listening to music may relax you and help you sleep. Use your bedroom only for sleep. Keep your television and computer out of your bedroom. Keep your bedroom cool, dark, and quiet. Use a supportive mattress and pillows. Follow your provider's instructions for other changes to sleep habits. Nutrition Do not eat big meals in the evening. Do not have caffeine in the later part of the day. The effects of caffeine can last for more than 5 hours. Follow your provider's instructions for any changes to what you eat and drink. Lifestyle Do not drink alcohol before bedtime. Alcohol can cause you to fall asleep at first, but then it can cause you to wake up in the middle of the night and have trouble getting back to sleep. Do not smoke, vape, or use nicotine or tobacco. Medicines Take over-the-counter and prescription medicines only as told by your provider. Do not use over-the-counter sleep medicine. You may become dependent on this medicine, and it can make sleep apnea worse. Do not take medicines, such as sedatives and narcotics, unless told to by your provider. Activity Exercise on most days, but avoid exercising in the evening. Exercising near bedtime can interfere with sleeping.  If possible, spend time outside every day. Natural light helps with your internal clock. General information Lose weight if you need to. Stay at a healthy weight. If you are having surgery, make sure to tell your provider that you have sleep apnea. You may need to bring your device with you. Keep all follow-up visits. Your provider will want to check on your condition. Where to find more information National Heart, Lung, and Blood  Institute: [employer] This information is not intended to replace advice given to you by your health care provider. Make sure you discuss any questions you have with your health care provider. Document Revised: 01/07/2023 Document Reviewed: 01/07/2023 Elsevier Patient Education  2024 [employer]Quality Sleep Information, Adult Quality sleep is important for your mental and physical health. It also improves your quality of life. Quality sleep means you: Are asleep for most of the time you are in bed. Fall asleep within 30 minutes. Wake up no more than once a night. Are awake for no longer than 20 minutes if you do wake up during the night. Most adults need 7-8 hours of quality sleep each night. How can poor sleep affect me? If you do not get enough quality sleep, you may have: Mood swings. Daytime sleepiness. Decreased alertness, reaction time, and concentration. Sleep disorders, such as insomnia and sleep apnea. Difficulty with: Solving problems. Coping with stress. Paying attention. These issues may affect your performance and productivity at work, school, and home. Lack of sleep may also put you at higher risk for accidents, suicide, and risky behaviors. If you do not get quality sleep, you may also be at higher risk for several health problems, including: Infections. Type 2 diabetes. Heart disease. High blood pressure. Obesity. Worsening of long-term conditions, like arthritis, kidney disease, depression, Parkinson's disease, and epilepsy. What actions can I take to get more quality sleep? Sleep schedule and routine Stick to a sleep schedule. Go to sleep and wake up at about the same time each day. Do not try to sleep less on weekdays and make up for lost sleep on weekends. This does not work. Limit naps during the day to 30 minutes or less. Do not take naps in the late afternoon. Make time to relax before bed. Reading, listening to music, or taking a hot bath promotes  quality sleep. Make your bedroom a place that promotes quality sleep. Keep your bedroom dark, quiet, and at a comfortable room temperature. Make sure your bed is comfortable. Avoid using electronic devices that give off bright blue light for 30 minutes before bedtime. Your brain perceives bright blue light as sunlight. This includes television, phones, and computers. If you are lying awake in bed for longer than 20 minutes, get up and do a relaxing activity until you feel sleepy. Lifestyle     Try to get at least 30 minutes of exercise on most days. Do not exercise 2-3 hours before going to bed. Do not use any products that contain nicotine or tobacco. These products include cigarettes, chewing tobacco, and vaping devices, such as e-cigarettes. If you need help quitting, ask your health care provider. Do not drink caffeinated beverages for at least 8 hours before going to bed. Coffee, tea, and some sodas contain caffeine. Do not drink alcohol or eat large meals close to bedtime. Try to get at least 30 minutes of sunlight every day. Morning sunlight is best. Medical concerns Work with your health care provider to treat medical conditions that may affect sleeping, such as: Nasal  obstruction. Snoring. Sleep apnea and other sleep disorders. Talk to your health care provider if you think any of your prescription medicines may cause you to have difficulty falling or staying asleep. If you have sleep problems, talk with a sleep consultant. If you think you have a sleep disorder, talk with your health care provider about getting evaluated by a specialist. Where to find more information Sleep Foundation: sleepfoundation.org American Academy of Sleep Medicine: aasm.org Centers for Disease Control and Prevention (CDC): [employer]no Contact a health care provider if: You have trouble getting to sleep or staying asleep. You often wake up very early in the morning and cannot get back to sleep. You have  daytime sleepiness. You have daytime sleep attacks of suddenly falling asleep and sudden muscle weakness (narcolepsy). You have a tingling sensation in your legs with a strong urge to move your legs (restless legs syndrome). You stop breathing briefly during sleep (sleep apnea). You think you have a sleep disorder or are taking a medicine that is affecting your quality of sleep. Summary Most adults need 7-8 hours of quality sleep each night. Getting enough quality sleep is important for your mental and physical health. Make your bedroom a place that promotes quality sleep, and avoid things that may cause you to have poor sleep, such as alcohol, caffeine, smoking, or large meals. Talk to your health care provider if you have trouble falling asleep or staying asleep. This information is not intended to replace advice given to you by your health care provider. Make sure you discuss any questions you have with your health care provider. Document Revised: 01/08/2022 Document Reviewed: 01/08/2022 Elsevier Patient Education  2024 [employer].Insomnia Insomnia is a sleep disorder that makes it difficult to fall asleep or stay asleep. Insomnia can cause fatigue, low energy, difficulty concentrating, mood swings, and poor performance at work or school. There are three different ways to classify insomnia: Difficulty falling asleep. Difficulty staying asleep. Waking up too early in the morning. Any type of insomnia can be long-term (chronic) or short-term (acute). Both are common. Short-term insomnia usually lasts for 3 months or less. Chronic insomnia occurs at least three times a week for longer than 3 months. What are the causes? Insomnia may be caused by another condition, situation, or substance, such as: Having certain mental health conditions, such as anxiety and depression. Using caffeine, alcohol, tobacco, or drugs. Having gastrointestinal conditions, such as gastroesophageal reflux disease  (GERD). Having certain medical conditions. These include: Asthma. Alzheimer's disease. Stroke. Chronic pain. An overactive thyroid gland (hyperthyroidism). Other sleep disorders, such as restless legs syndrome and sleep apnea. Menopause. Sometimes, the cause of insomnia may not be known. What increases the risk? Risk factors for insomnia include: Gender. Females are affected more often than males. Age. Insomnia is more common as people get older. Stress and certain medical and mental health conditions. Lack of exercise. Having an irregular work schedule. This may include working night shifts and traveling between different time zones. What are the signs or symptoms? If you have insomnia, the main symptom is having trouble falling asleep or having trouble staying asleep. This may lead to other symptoms, such as: Feeling tired or having low energy. Feeling nervous about going to sleep. Not feeling rested in the morning. Having trouble concentrating. Feeling irritable, anxious, or depressed. How is this diagnosed? This condition may be diagnosed based on: Your symptoms and medical history. Your health care provider may ask about: Your sleep habits. Any medical conditions you have. Your  mental health. A physical exam. How is this treated? Treatment for insomnia depends on the cause. Treatment may focus on treating an underlying condition that is causing the insomnia. Treatment may also include: Medicines to help you sleep. Counseling or therapy. Lifestyle adjustments to help you sleep better. Follow these instructions at home: Eating and drinking  Limit or avoid alcohol, caffeinated beverages, and products that contain nicotine and tobacco, especially close to bedtime. These can disrupt your sleep. Do not eat a large meal or eat spicy foods right before bedtime. This can lead to digestive discomfort that can make it hard for you to sleep. Sleep habits  Keep a sleep diary to  help you and your health care provider figure out what could be causing your insomnia. Write down: When you sleep. When you wake up during the night. How well you sleep and how rested you feel the next day. Any side effects of medicines you are taking. What you eat and drink. Make your bedroom a dark, comfortable place where it is easy to fall asleep. Put up shades or blackout curtains to block light from outside. Use a white noise machine to block noise. Keep the temperature cool. Limit screen use before bedtime. This includes: Not watching TV. Not using your smartphone, tablet, or computer. Stick to a routine that includes going to bed and waking up at the same times every day and night. This can help you fall asleep faster. Consider making a quiet activity, such as reading, part of your nighttime routine. Try to avoid taking naps during the day so that you sleep better at night. Get out of bed if you are still awake after 15 minutes of trying to sleep. Keep the lights down, but try reading or doing a quiet activity. When you feel sleepy, go back to bed. General instructions Take over-the-counter and prescription medicines only as told by your health care provider. Exercise regularly as told by your health care provider. However, avoid exercising in the hours right before bedtime. Use relaxation techniques to manage stress. Ask your health care provider to suggest some techniques that may work well for you. These may include: Breathing exercises. Routines to release muscle tension. Visualizing peaceful scenes. Make sure that you drive carefully. Do not drive if you feel very sleepy. Keep all follow-up visits. This is important. Contact a health care provider if: You are tired throughout the day. You have trouble in your daily routine due to sleepiness. You continue to have sleep problems, or your sleep problems get worse. Get help right away if: You have thoughts about hurting  yourself or someone else. Get help right away if you feel like you may hurt yourself or others, or have thoughts about taking your own life. Go to your nearest emergency room or: Call 911. Call the National Suicide Prevention Lifeline at 469-698-0574 or 988. This is open 24 hours a day. Text the Crisis Text Line at 715-857-0580. Summary Insomnia is a sleep disorder that makes it difficult to fall asleep or stay asleep. Insomnia can be long-term (chronic) or short-term (acute). Treatment for insomnia depends on the cause. Treatment may focus on treating an underlying condition that is causing the insomnia. Keep a sleep diary to help you and your health care provider figure out what could be causing your insomnia. This information is not intended to replace advice given to you by your health care provider. Make sure you discuss any questions you have with your health care provider. Document Revised:  08/26/2021 Document Reviewed: 08/26/2021 Elsevier Patient Education  2024 [employer].

## 2024-06-22 NOTE — Progress Notes (Signed)
 @[facility]   Provider:  Dedra Gores, MD  Primary Care Physician:  Loreli Elsie JONETTA Mickey., MD [address] [place] [address]  Referring Provider: Loreli Elsie JONETTA Mickey., Md [address] [place],  [place] [address]        Chief Concern for this Consultation:   Patient presents with      Chronic insomnia, FSS 56/ 63, ESS 9/ 24.      HPI: I have the pleasure of meeting with Richard Serrano, on 06-22-2024, who is a 63 y.o. Caucasian  male patient,  seen upon a referral by Dr Loreli  for a  Sleep Medicine Consultation.   The patient's referral information asked for a sleep study , rule in- out OSA.  Chief concern according to patient: : I  take melatonin and hemp to get to sleep . Dr Loreli had given me trazodone 50 mg po     Richard Yidel L.  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  Severe OSA reportedly treated on CPAP,  and meanwhile is using the third CPAP machine, displayed a message : The motor life is at end  ENT surgery or problems: rhinitis, ( chronically using afrin ) Sinusitis, no Autoimmune disorders,  no Anemia, PLMs ,GERD,  DM 2 ,  , struggled maybe  with mild Depression.  When depressed he slept more, when anxious more insomnia.    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  The patient currently is involved in physical activity, outdoor activity, travel.  Nicotine use: never   ETOH use: / rare - 1 / week ,  Caffeine intake in form of: Coffee ( 1 cup in AM ), Soft drinks (2 cans  a week), Tea (/ ) nor Energy drinks (.     Sleep habits and routines are as follows: The patient's dinner time is around 6-8 PM.  Evening time is spent by watching videos, movies . The patient goes to bed at, or close to, 9-12 PM. The bedroom is no longer shared with spouse and is described as cool, quiet, and dark. He moves in his sleep - a lot.   The patient reports that it takes 60 minutes to fall asleep, then continues to sleep  for 1 hour, interrupted or woken up by  need to void (Nocturia).  EVERY HOUR>   The preferred sleep position is prone /  supine, with support of 2 pillows, (non- adjustable bed/ recliner ).  The total estimated sleep time is circa 4-6 hours.  Dreams are reportedly frequent and can be vivid. Dream enactment has not been reported.   9-12 AM is the usual week- day rise time. The patient wakes up spontaneously.    He reports not feeling refreshed and restored in the morning, waking with symptoms such as dry mouth,  stiffness or pain, and fatigue.  No sleep paralysis has been experienced.  Nocturia is a problem.  No Naps are taken  in daytime or are taken infrequently (there is a desire to nap and opportunity but he cent fall asleep-),   Out of a complete 14 system review, the patient complains of only the following symptoms, and all other reviewed systems are negative.:  Hypersomnia   Insomnia  Depression/ anxiety:  Snoring,  Sleep fragmentation, Nocturia   How likely are you to doze in the following situations: 0 = not likely, 1 = slight chance, 2 = moderate chance, 3 = high chance Sitting and Reading? Watching Television? Sitting inactive in a public place (theater or meeting)? As a passenger in a car for an hour without a break? Lying down in the afternoon when circumstances permit? Sitting and talking to someone? Sitting quietly after lunch without alcohol? In a car, while stopped for a few minutes in traffic?   Total ESS =9 / 24 points.    FSS endorsed at 56/ 63 points.  GDS: NA Social History   Socioeconomic History   Marital status: Married    Spouse name: Not on file   Number of children: Not on file   Years of education: Not on file   Highest education level: Not on file  Occupational History   Not on file  Tobacco Use   Smoking status: Never   Smokeless tobacco: Never  Vaping Use   Vaping status: Never Used  Substance and Sexual Activity   Alcohol use: Yes    Comment: 2 mixed drinks on weekends   Drug use: No   Sexual activity: Not on file  Other Topics Concern   Not on file  Social History Narrative   Not on file   Social Drivers of Health   Financial Resource Strain: Not on file  Food Insecurity: Not on file  Transportation Needs: Not on file  Physical Activity: Not on file  Stress: Not on file  Social Connections: Not on file    Family History  Problem Relation Age of Onset   Colon polyps Mother    Colon cancer Neg Hx    Esophageal cancer Neg Hx    Rectal cancer Neg Hx    Stomach cancer Neg Hx     Past Medical History:  Diagnosis Date   Benign prostatic hypertrophy    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   Depression    Diabetes mellitus without complication (HCC)    Hx of adenomatous polyp of rectum 09/10/2023   10/12 mm rectal adenoma 12/9 - recall 08/2026 - hospital case BMI > 50    Hyperlipidemia    Hypertension    Metabolic syndrome    Shortness of  breath    Sleep apnea    Cpap    Past Surgical History:  Procedure Laterality Date   APPENDECTOMY  09/29/2010   CHOLECYSTECTOMY  COLONOSCOPY N/A 01/26/2013   Procedure: COLONOSCOPY;  Surgeon: Lupita FORBES Commander, MD;  Location: [facility];  Service: Endoscopy;  Laterality: N/A;   COLONOSCOPY WITH PROPOFOL  N/A 09/07/2023   Procedure: COLONOSCOPY WITH PROPOFOL ;  Surgeon: Commander Lupita FORBES, MD;  Location: [facility];  Service: Gastroenterology;  Laterality: N/A;   POLYPECTOMY  09/07/2023   Procedure: POLYPECTOMY;  Surgeon: Commander Lupita FORBES, MD;  Location: [facility];  Service: Gastroenterology;;     Current Outpatient Medications on File Prior to Visit  Medication Sig Dispense Refill   Ascorbic Acid (VITAMIN C) 1000 MG tablet Take 1,000 mg by mouth daily.     aspirin 81 MG tablet Take 81 mg by mouth daily.     atorvastatin (LIPITOR) 40 MG tablet Take 40 mg by mouth daily.     buPROPion (WELLBUTRIN XL) 150 MG 24 hr tablet Take 150 mg by mouth every morning.     Cyanocobalamin (B-12) 50 MCG TABS Take 1 tablet by mouth daily.     docusate sodium (COLACE) 100 MG capsule Take 100 mg by mouth 2 (two) times daily.     doxazosin (CARDURA) 4 MG tablet Take 4 mg by mouth daily.     escitalopram  (LEXAPRO ) 10 MG tablet Take 2 tablets (20 mg total) by mouth daily. One in AM po 60 tablet 3   finasteride (PROSCAR) 5 MG tablet Take 5 mg by mouth daily.     glucose blood (ONETOUCH VERIO) test strip Use to self monitor blood glucose daily and as needed In Vitro     lisinopril (PRINIVIL,ZESTRIL) 20 MG tablet Take 20 mg by mouth daily.     Melatonin 10 MG CHEW Chew 30 mg by mouth at bedtime.     metFORMIN (GLUCOPHAGE) 1000 MG tablet Take 500 mg by mouth daily at 6 (six) AM. (Patient taking differently: Take 500 mg by mouth daily with breakfast.)     MOUNJARO 7.5 MG/0.5ML Pen SMARTSIG:7.5 Milligram(s) SUB-Q Once a Week (Patient taking differently: Inject 12.5 mg into the skin once a week.)     Multiple  Vitamin (MULTIVITAMIN) tablet Take 1 tablet by mouth daily.     Omega-3 Fatty Acids (FISH OIL) 1200 MG CAPS Take 1 capsule by mouth daily.     Turmeric (QC TUMERIC COMPLEX PO) Take by mouth daily at 6 (six) AM. 1000     No current facility-administered medications on file prior to visit.    No Known Allergies  Vitals:   06/22/24 1006  BP: 110/80  Pulse: 82   He describes that Lunesta, Ambien, and several other over-the-counter medications have not helped to alleviate his insomnia. It is not clear to me if this is subjective or objective insomnia a question of sleep perception or if there are ever organic reasons for insomnia. His apnea seems very well treated.       Physical exam:   General: The patient was alert and appears not in acute distress.  Mood and affect are appropriate .  The patient's interactions are: Cooperative, makes eye contact, follows the instructions and answers questions coherently.  The patient is groomed and appropriately groomed and dressed. Head: Normocephalic, atraumatic.  Neck is supple. Mallampati: 5 ( 3 plus) .  The neck circumference measured 22 inches. Nasal airflow was not fully patent.    Overbite / Retrognathia was noted.  Dental status:  biological  Cardiovascular:  Regular rate and cardiac rhythm by palpable pulse. Respiratory: no audible wheezing, no tachypnoea.   Skin:  Without evidence of  ankle edema. No discoloration.  Trunk:  BMI is 55.44 The patient's posture was erect.   Neurologic exam : The patient was awake and alert, oriented to place and time.   Attention span & concentration ability appeared normal.  Speech was fluent, without dysarthria, dysphonia or aphasia, and of normal volume.     Cranial nerves:  There was no loss of smell or taste reported  Pupils are round, equal in size and briskly reactive to light.  Funduscopic exam was deferred.  Extraocular movements in vertical and horizontal planes were intact and without  nystagmus. (No Diplopia reported). Visual fields by finger perimetry are intact. Hearing was intact to soft voice.    Facial sensation intact to fine touch.  Facial motor strength: Symmetric movement and tongue and uvula move midline.  Neck ROM: rotation, tilt and flexion extension were intact for age and shoulder shrug was symmetrical.    Motor exam:  Symmetric bulk, strength and ROM.   Normal tone without cog- wheeling, and symmetric grip strength.     Deep tendon reflexes: Upper extremities did show symmetric DTRs.      I would like to thank Loreli Elsie JONETTA Mickey., Md [address] [place],  [place] [address] for allowing me to meet again with  Richard Richard Serrano  1) In short, he is presenting with primary insomnia, chronic l longstanding and  likely unrelated to OSA. ,  and we disucssed the need for slep routines, from a set dinner time( no later than 7 Pm and bedtime before midnight , to no screen bedroom, hot shower  before bedtime, and rise time within 8 hours of bedtime. _ If sleep happened or not !    2)  I do do not doubt that OSA is still present, maybe  obesity hypoventilation: Risk factors for OSA were present,  including : Body mass index is 55.44 kg/m., neck size 22  and upper airway anatomy. Grade 5.  CPAP has been used for 10 years or longer and is complaint, k low residual AHI did not correlate to better sleep quality,   3 ) FREQUENT Nocturia interrupted sleep for the last 10 years, still ongoing.  Urological work -up   3) fatigue out-weights sleepiness.  OSA main risk factor is weight, he started on Mounjaro, 50 pounds weight loss, still BMI > 55.      My Plan is to proceed with:  PSG: SPLIT study , AHI 20/h .    Modafinil was discussed : This patient has a documented diagnosis of sleep apnea also I do want to update his diagnosis by a new sleep test.  He would qualify based on his described daytime sleepiness and fatigue to try modafinil at least and I think it may help  with the fatigue component.  His weight has already been addressed with Dr. Loreli and he has started on Mounjaro about a year ago and is still on an uptitration.  Improvement of blood glucose levels has not led to a decrease in nocturia.  He has seen urology for this problem.  Mood disorders may be at the origin of high fatigue but also chronic inflammatory conditions ( here psoriatic skin lesions and arthritis)  chronic medical conditions aside from depression or anxiety.     I plan to follow up personally within 3-5 months.   A total time of  45  minutes consistent of a part of face to face encounter , exam and interview,  and additional preparation time for chart  review was spent .  At today's visit, we discussed treatment options, associated risk and benefits, and engage in counseling as needed including, but not limited to:  Sleep hygiene, Quality Sleep Habits, and Safety concerns for patients with daytime sleepiness who are warned to not operate machinery/ motor vehicles when drowsy. Risk factors for sleep apnea were identified.  Risk factors for insomnia were identified. .  Additionally, the following were reviewed: Past medical records, past medical and surgical history, family and social background, as well as relevant laboratory results, imaging findings, and medical notes, where applicable.  This note was generated by myself in part by using dictation software, and as a result, it may contain unintentional typos and errors.  Nevertheless, effort was made to accurately convey the pertinent aspects of the patient's visit.   Dedra Gores, MD   [facility] Sleep Board certified in Sleep Medicine by The [employer] of Sleep Medicine and Diplomate of the [employer] of Sleep [employer] (AASM) . Board certified In Neurology, Diplomat of the ABPN,  Fellow of the [employer] of Neurology.

## 2024-06-28 ENCOUNTER — Telehealth: Payer: Self-pay | Admitting: Neurology

## 2024-06-28 DIAGNOSIS — E662 Morbid (severe) obesity with alveolar hypoventilation: Secondary | ICD-10-CM

## 2024-06-28 DIAGNOSIS — F5104 Psychophysiologic insomnia: Secondary | ICD-10-CM

## 2024-06-28 DIAGNOSIS — R351 Nocturia: Secondary | ICD-10-CM

## 2024-06-28 DIAGNOSIS — E11621 Type 2 diabetes mellitus with foot ulcer: Secondary | ICD-10-CM

## 2024-06-28 DIAGNOSIS — R5382 Chronic fatigue, unspecified: Secondary | ICD-10-CM

## 2024-06-28 DIAGNOSIS — Z6841 Body Mass Index (BMI) 40.0 and over, adult: Secondary | ICD-10-CM

## 2024-06-28 DIAGNOSIS — G4733 Obstructive sleep apnea (adult) (pediatric): Secondary | ICD-10-CM

## 2024-06-28 NOTE — Telephone Encounter (Signed)
 Split [facility] pending

## 2024-06-30 NOTE — Telephone Encounter (Signed)
 Checked status on the portal it is still pending

## 2024-07-10 ENCOUNTER — Other Ambulatory Visit: Payer: Self-pay | Admitting: Neurology

## 2024-07-11 NOTE — Telephone Encounter (Signed)
 Last filled by patient on 06/22/24 Last office visit : 06/22/24  Next office visit : due back 11/22/24   Please advise if patient is suppose to continue medication only 2 tablets were sent in on 06/22/24.

## 2024-07-11 NOTE — Telephone Encounter (Signed)
 [employer] denied the split sleep study. See below for the reason. Do you want to order a HST?

## 2024-07-14 NOTE — Addendum Note (Signed)
 Addended by: CHALICE SAUNAS on: 07/14/2024 05:51 PM   Modules accepted: Orders

## 2024-07-15 NOTE — Telephone Encounter (Signed)
 Noted, thank you.   HST [employer] no auth req NPSG denied

## 2024-08-12 ENCOUNTER — Ambulatory Visit (INDEPENDENT_AMBULATORY_CARE_PROVIDER_SITE_OTHER): Admitting: Neurology

## 2024-08-12 DIAGNOSIS — E662 Morbid (severe) obesity with alveolar hypoventilation: Secondary | ICD-10-CM

## 2024-08-12 DIAGNOSIS — E11621 Type 2 diabetes mellitus with foot ulcer: Secondary | ICD-10-CM

## 2024-08-12 DIAGNOSIS — F5104 Psychophysiologic insomnia: Secondary | ICD-10-CM

## 2024-08-12 DIAGNOSIS — R5382 Chronic fatigue, unspecified: Secondary | ICD-10-CM

## 2024-08-12 DIAGNOSIS — G4733 Obstructive sleep apnea (adult) (pediatric): Secondary | ICD-10-CM | POA: Diagnosis not present

## 2024-08-12 DIAGNOSIS — R351 Nocturia: Secondary | ICD-10-CM

## 2024-08-12 DIAGNOSIS — L97509 Non-pressure chronic ulcer of other part of unspecified foot with unspecified severity: Secondary | ICD-10-CM

## 2024-08-12 DIAGNOSIS — Z6841 Body Mass Index (BMI) 40.0 and over, adult: Secondary | ICD-10-CM

## 2024-08-17 ENCOUNTER — Encounter: Payer: Self-pay | Admitting: Orthopaedic Surgery

## 2024-08-17 ENCOUNTER — Ambulatory Visit: Admitting: Orthopaedic Surgery

## 2024-08-17 DIAGNOSIS — M25562 Pain in left knee: Secondary | ICD-10-CM | POA: Diagnosis not present

## 2024-08-17 DIAGNOSIS — M25561 Pain in right knee: Secondary | ICD-10-CM

## 2024-08-17 DIAGNOSIS — G8929 Other chronic pain: Secondary | ICD-10-CM | POA: Diagnosis not present

## 2024-08-17 MED ORDER — METHYLPREDNISOLONE ACETATE 40 MG/ML IJ SUSP
40.0000 mg | INTRAMUSCULAR | Status: AC | PRN
  Administered 2024-08-17: 40 mg via INTRA_ARTICULAR

## 2024-08-17 MED ORDER — LIDOCAINE HCL 1 % IJ SOLN
3.0000 mL | INTRAMUSCULAR | Status: AC | PRN
  Administered 2024-08-17: 3 mL

## 2024-08-17 NOTE — Progress Notes (Signed)
 The patient comes in today for steroid injection in both his knees today.  It has been over 101-month since his last steroid injection in his knees.  He does have known arthritis in his knees.  He is 63 years old.  He says the injections last about 3 months.  He has been a diabetic in the past but his blood sugars under better control and he continues to lose weight.  On exam both knees have slight varus malalignment but no effusion with good range of motion.  There are some global tenderness with both knees.  Per his request we did place a steroid injection in both knees today which he tolerated well.  Will see him back in 3 months to consider repeat steroid injections.  At his next visit we do need a weight and BMI calculation.     Procedure Note  Patient: Richard Serrano             Date of Birth: 13-Jan-1961           MRN: [id number]             Visit Date: 08/17/2024  Procedures: Visit Diagnoses:  1. Chronic pain of left knee   2. Chronic pain of right knee     Large Joint Inj: R knee on 08/17/2024 1:17 PM Indications: diagnostic evaluation and pain Details: 22 G 1.5 in needle, superolateral approach  Arthrogram: No  Medications: 3 mL lidocaine  1 %; 40 mg methylPREDNISolone  acetate 40 MG/ML Outcome: tolerated well, no immediate complications Procedure, treatment alternatives, risks and benefits explained, specific risks discussed. Consent was given by the patient. Immediately prior to procedure a time out was called to verify the correct patient, procedure, equipment, support staff and site/side marked as required. Patient was prepped and draped in the usual sterile fashion.    Large Joint Inj: L knee on 08/17/2024 1:17 PM Indications: diagnostic evaluation and pain Details: 22 G 1.5 in needle, superolateral approach  Arthrogram: No  Medications: 3 mL lidocaine  1 %; 40 mg methylPREDNISolone  acetate 40 MG/ML Outcome: tolerated well, no immediate complications Procedure,  treatment alternatives, risks and benefits explained, specific risks discussed. Consent was given by the patient. Immediately prior to procedure a time out was called to verify the correct patient, procedure, equipment, support staff and site/side marked as required. Patient was prepped and draped in the usual sterile fashion.

## 2024-09-14 ENCOUNTER — Telehealth: Payer: Self-pay | Admitting: Neurology

## 2024-09-14 NOTE — Telephone Encounter (Signed)
Please result when available  

## 2024-09-14 NOTE — Telephone Encounter (Signed)
 Pt asking to be called with results to sleep study when available.

## 2024-09-15 NOTE — Telephone Encounter (Signed)
 Study is uploaded to the chart and message has been sent to MD

## 2024-09-15 NOTE — Progress Notes (Signed)
 "      [facility]   HOME SLEEP TEST REPORT ( by Elene  mail -out device )   Richard Serrano 63 year old male 08-08-1961     STUDY DATE:   08-12-2024 Data received :  09-15-2024    ORDERING CLINICIAN:  Dedra Gores, MD  REFERRING CLINICIAN: Dr Elsie JONETTA Gentry   CLINICAL INFORMATION/HISTORY: I have the pleasure of meeting with Richard Serrano, on 06-22-2024, who is a 63 y.o. Caucasian  male patient,  seen upon a referral by Dr Gentry  for a  Sleep Medicine Consultation.  the patient is suffering from Insomnia.   The patient's referral information asked for a sleep study , rule in- out OSA.  Chief concern according to patient: : I  take melatonin and hemp to get to sleep . Dr Gentry had given me trazodone 50 mg po   He describes that Lunesta, Ambien, and several other over-the-counter medications have not helped to alleviate his insomnia. It is not clear to me if this is subjective or objective insomnia a question of sleep perception or if there are ever organic reasons for insomnia. His apnea seems very well treated.  In short, he is presenting with primary insomnia, chronic l longstanding and  likely unrelated to OSA. ,  and we disucssed the need for slep routines, from a set dinner time( no later than 7 Pm and bedtime before midnight , to no screen bedroom, hot shower  before bedtime, and rise time within 8 hours of bedtime. _ If sleep happened or not !      I do do not doubt that OSA is still present, maybe  obesity hypoventilation: Risk factors for OSA were present,  including : Body mass index is 55.44 kg/m., neck size 22  and upper airway anatomy. Grade 5.  CPAP has been used for 10 years or longer and is complaint, k low residual AHI did not correlate to better sleep quality,   FREQUENT Nocturia interrupted sleep for the last 10 years, still ongoing.  Urological work -up  Please note that  fatigue out-weights sleepiness.   OSA main risk factor is weight, he started on  Mounjaro, 50 pounds weight loss, still BMI > 55.      My Plan was  to proceed with: PSG: SPLIT study , AHI 20/h .  Modafinil was discussed :     Epworth sleepiness score:9 / 24 points.    FSS endorsed at 56/ 63 points.   BMI: 55.44 kg/m  Neck Circumference:  22     Sleep Summary:   Start Recording Time (hours, min):     09-03-2024 at 21:50 hours    Total Sleep Time (hours, min):   6 h 58 m    Sleep efficiency %;        71 %                                Respiratory Indices by AASM criteria of scoring;    Calculated pAHI (per hour):      19.8/h                                            Positional  respiratory activity  / snoring :intermittent snoring. Higher AHI in right lateral sleep in  comparison to supine sleep !   Oxygen Saturation  in Sleep    Oxygen Saturation (%) Mean:     95.4%              O2 Saturation Range (%):   75.6 through 100%                                      O2 Saturation (minutes) <89%:    2 minutes        Pulse Rate in Sleep :   Pulse Mean (bpm):     75 bpm, regular -             Pulse Range:   65 - 88 bpm               IMPRESSION:  This HST confirms the persistent presence of  moderate degree of sleep apnea, obstructive sleep apnea.  The HST showed clusters of hypoxia , of snoring, which are likely REM sleep related clusters. Weight loss is the most important  therapy for the long future.  CPAP is the main therapy for now, please provide an autotitration ResMed device with mask of choice and heated humidification. 5-15 cm water l   RECOMMENDATION: if the patient is due for a new CPAP, please provide an ResMed autotitration device, 5-15 cm water, 3 cm EPR and mask of choice.  Any Patient endorsing a high level of sleepiness should be cautioned not to drive, work at heights, or operate dangerous machinery or heavy equipment when tired or sleepy.  Review of good sleep hygiene measures took place in the initial consultation but should be  revisited ( Your guide to better sleep  a publication by the NIH is a good source of information).   The referring provider will be notified of the test results.    I certify that I have reviewed the raw data recording prior to the issuance of this report in accordance with the standards of the American Academy of Sleep Medicine (AASM).    INTERPRETING PHYSICIAN:   Dedra Gores, MD  [facility] Sleep Board certified by The [employer] of Sleep Medicine and Diplomate of the [employer] of Sleep Medicine. Board certified In Neurology through the ABPN, Fellow of the [employer] Neu[employer].             "

## 2024-09-27 ENCOUNTER — Ambulatory Visit: Payer: Self-pay | Admitting: Neurology

## 2024-09-27 NOTE — Procedures (Signed)
 "     [facility]   HOME SLEEP TEST REPORT ( by Elene  mail -out device )   Richard Serrano 63 year old male 10-16-1960     STUDY DATE:   08-12-2024 Data received :  09-15-2024    ORDERING CLINICIAN:  Dedra Gores, MD  REFERRING CLINICIAN: Dr Elsie JONETTA Gentry   CLINICAL INFORMATION/HISTORY: I have the pleasure of meeting with Richard Serrano, on 06-22-2024, who is a 63 y.o. Caucasian  male patient,  seen upon a referral by Dr Gentry  for a  Sleep Medicine Consultation.  the patient is suffering from Insomnia.   The patient's referral information asked for a sleep study , rule in- out OSA.  Chief concern according to patient: : I  take melatonin and hemp to get to sleep . Dr Gentry had given me trazodone 50 mg po   He describes that Lunesta, Ambien, and several other over-the-counter medications have not helped to alleviate his insomnia. It is not clear to me if this is subjective or objective insomnia a question of sleep perception or if there are ever organic reasons for insomnia. His apnea seems very well treated.  In short, he is presenting with primary insomnia, chronic l longstanding and  likely unrelated to OSA. ,  and we disucssed the need for slep routines, from a set dinner time( no later than 7 Pm and bedtime before midnight , to no screen bedroom, hot shower  before bedtime, and rise time within 8 hours of bedtime. _ If sleep happened or not !      I do do not doubt that OSA is still present, maybe  obesity hypoventilation: Risk factors for OSA were present,  including : Body mass index is 55.44 kg/m., neck size 22  and upper airway anatomy. Grade 5.  CPAP has been used for 10 years or longer and is complaint, k low residual AHI did not correlate to better sleep quality,   FREQUENT Nocturia interrupted sleep for the last 10 years, still ongoing.  Urological work -up  Please note that  fatigue out-weights sleepiness.   OSA main risk factor is weight, he started on  Mounjaro, 50 pounds weight loss, still BMI > 55.      My Plan was  to proceed with: PSG: SPLIT study , AHI 20/h .  Modafinil was discussed :     Epworth sleepiness score:9 / 24 points.    FSS endorsed at 56/ 63 points.   BMI: 55.44 kg/m  Neck Circumference:  22     Sleep Summary:   Start Recording Time (hours, min):     09-03-2024 at 21:50 hours    Total Sleep Time (hours, min):   6 h 58 m    Sleep efficiency %;        71 %                                Respiratory Indices by AASM criteria of scoring;    Calculated pAHI (per hour):      19.8/h                                            Positional  respiratory activity  / snoring :intermittent snoring. Higher AHI in right lateral sleep in comparison  to supine sleep !   Oxygen Saturation  in Sleep    Oxygen Saturation (%) Mean:     95.4%              O2 Saturation Range (%):   75.6 through 100%                                      O2 Saturation (minutes) <89%:    2 minutes        Pulse Rate in Sleep :   Pulse Mean (bpm):     75 bpm, regular -             Pulse Range:   65 - 88 bpm               IMPRESSION:  This HST confirms the persistent presence of  moderate degree of sleep apnea, obstructive sleep apnea.  The HST showed clusters of hypoxia , of snoring, which are likely REM sleep related clusters. Weight loss is the most important  therapy for the long future.  CPAP is the main therapy for now, please provide an autotitration ResMed device with mask of choice and heated humidification. 5-15 cm water l   RECOMMENDATION: if the patient is due for a new CPAP, please provide an ResMed autotitration device, 5-15 cm water, 3 cm EPR and mask of choice.  Any Patient endorsing a high level of sleepiness should be cautioned not to drive, work at heights, or operate dangerous machinery or heavy equipment when tired or sleepy.  Review of good sleep hygiene measures took place in the initial consultation but should be  revisited ( Your guide to better sleep  a publication by the NIH is a good source of information).   The referring provider will be notified of the test results.    I certify that I have reviewed the raw data recording prior to the issuance of this report in accordance with the standards of the American Academy of Sleep Medicine (AASM).    INTERPRETING PHYSICIAN:   Dedra Gores, MD  [facility] Sleep Board certified by The [employer] and Diplomate of the [employer] of [employer]. Board certified In Neurology through the ABPN, Fellow of the [employer] of [employer].             "

## 2024-10-10 ENCOUNTER — Telehealth: Payer: Self-pay

## 2024-10-10 NOTE — Telephone Encounter (Signed)
 Pt LVM on sleep lab phone asking about results from sleep study previously completed

## 2024-10-11 NOTE — Telephone Encounter (Signed)
 Aryannah Mohon D, CMA  Richard Serrano, Richard Serrano, Richard Serrano; Richard Serrano New orders have been placed for the above pt, DOB: 08-Jul-2061. Pt has [employer] for the new yr, advised to bring insurance cards to appt.   Thanks

## 2024-10-11 NOTE — Telephone Encounter (Signed)
 I called pt. I advised pt that Dr. Margean reviewed their sleep study results and found that pt moderate OSA. Dr. Chalice recommends that pt conties pap therapy. I reviewed PAP compliance expectations with the pt. Pt is agreeable to starting a CPAP. I advised pt that an order will be sent to a DME, [facility], and [facility] will call the pt within about one week after they file with the pt's insurance. [facility] will show the pt how to use the machine, fit for masks, and troubleshoot the CPAP if needed. Advised pt to call the office to schedule initial CPAP visit 31-90 days.  Pt verbalized understanding and to bring their CPAP. Pt verbalized understanding of results. Pt had no questions at this time but was encouraged to call back if questions arise. I have sent the order to [facility] and have received confirmation that they have received the order.

## 2024-10-11 NOTE — Telephone Encounter (Signed)
 Addressed in TE 09/27/24

## 2024-11-23 ENCOUNTER — Ambulatory Visit: Admitting: Orthopaedic Surgery
